# Patient Record
Sex: Female | Born: 1975 | Race: White | Hispanic: No | Marital: Single | Smoking: Current every day smoker
Health system: Southern US, Community
[De-identification: ages and names within clinical notes are randomized; demographics above are authoritative.]

## PROBLEM LIST (undated history)

## (undated) DIAGNOSIS — F329 Major depressive disorder, single episode, unspecified: Secondary | ICD-10-CM

## (undated) DIAGNOSIS — Z972 Presence of dental prosthetic device (complete) (partial): Secondary | ICD-10-CM

## (undated) DIAGNOSIS — F419 Anxiety disorder, unspecified: Secondary | ICD-10-CM

## (undated) DIAGNOSIS — E039 Hypothyroidism, unspecified: Secondary | ICD-10-CM

## (undated) DIAGNOSIS — Z973 Presence of spectacles and contact lenses: Secondary | ICD-10-CM

## (undated) DIAGNOSIS — R519 Headache, unspecified: Secondary | ICD-10-CM

## (undated) DIAGNOSIS — M8700 Idiopathic aseptic necrosis of unspecified bone: Secondary | ICD-10-CM

## (undated) DIAGNOSIS — R51 Headache: Secondary | ICD-10-CM

## (undated) DIAGNOSIS — J45909 Unspecified asthma, uncomplicated: Secondary | ICD-10-CM

## (undated) DIAGNOSIS — G2581 Restless legs syndrome: Secondary | ICD-10-CM

## (undated) DIAGNOSIS — M419 Scoliosis, unspecified: Secondary | ICD-10-CM

## (undated) DIAGNOSIS — Z8489 Family history of other specified conditions: Secondary | ICD-10-CM

## (undated) DIAGNOSIS — K759 Inflammatory liver disease, unspecified: Secondary | ICD-10-CM

## (undated) DIAGNOSIS — M539 Dorsopathy, unspecified: Secondary | ICD-10-CM

## (undated) DIAGNOSIS — F32A Depression, unspecified: Secondary | ICD-10-CM

## (undated) HISTORY — PX: JOINT REPLACEMENT: SHX530

## (undated) HISTORY — PX: ABDOMINAL HYSTERECTOMY: SHX81

## (undated) HISTORY — PX: DILATION AND CURETTAGE OF UTERUS: SHX78

---

## 2004-05-28 ENCOUNTER — Other Ambulatory Visit: Payer: Self-pay

## 2004-07-19 ENCOUNTER — Emergency Department: Payer: Self-pay | Admitting: Emergency Medicine

## 2005-02-24 ENCOUNTER — Emergency Department: Payer: Self-pay | Admitting: Emergency Medicine

## 2005-02-24 ENCOUNTER — Other Ambulatory Visit: Payer: Self-pay

## 2005-04-23 ENCOUNTER — Emergency Department: Payer: Self-pay | Admitting: Emergency Medicine

## 2005-07-24 ENCOUNTER — Ambulatory Visit: Payer: Self-pay | Admitting: Unknown Physician Specialty

## 2005-08-07 ENCOUNTER — Ambulatory Visit: Payer: Self-pay | Admitting: Unknown Physician Specialty

## 2005-12-17 ENCOUNTER — Emergency Department: Payer: Self-pay | Admitting: Unknown Physician Specialty

## 2006-05-04 ENCOUNTER — Emergency Department: Payer: Self-pay

## 2006-05-25 ENCOUNTER — Inpatient Hospital Stay: Payer: Self-pay | Admitting: Internal Medicine

## 2006-08-07 ENCOUNTER — Emergency Department: Payer: Self-pay | Admitting: Internal Medicine

## 2006-09-08 ENCOUNTER — Ambulatory Visit: Payer: Self-pay | Admitting: Unknown Physician Specialty

## 2006-10-20 ENCOUNTER — Inpatient Hospital Stay: Payer: Self-pay | Admitting: Unknown Physician Specialty

## 2006-11-05 ENCOUNTER — Observation Stay: Payer: Self-pay | Admitting: Unknown Physician Specialty

## 2006-12-02 ENCOUNTER — Ambulatory Visit: Payer: Self-pay | Admitting: Anesthesiology

## 2006-12-08 ENCOUNTER — Ambulatory Visit: Payer: Self-pay | Admitting: Anesthesiology

## 2006-12-13 ENCOUNTER — Other Ambulatory Visit: Payer: Self-pay

## 2006-12-13 ENCOUNTER — Emergency Department: Payer: Self-pay | Admitting: Emergency Medicine

## 2006-12-15 ENCOUNTER — Ambulatory Visit: Payer: Self-pay | Admitting: Anesthesiology

## 2007-04-17 ENCOUNTER — Emergency Department: Payer: Self-pay

## 2007-05-05 ENCOUNTER — Emergency Department: Payer: Self-pay | Admitting: Emergency Medicine

## 2007-05-05 ENCOUNTER — Other Ambulatory Visit: Payer: Self-pay

## 2007-06-02 ENCOUNTER — Ambulatory Visit: Payer: Self-pay | Admitting: Unknown Physician Specialty

## 2007-06-16 ENCOUNTER — Ambulatory Visit: Payer: Self-pay | Admitting: Unknown Physician Specialty

## 2007-06-17 ENCOUNTER — Encounter: Admission: RE | Admit: 2007-06-17 | Discharge: 2007-06-17 | Payer: Self-pay | Admitting: Orthopedic Surgery

## 2007-09-01 ENCOUNTER — Encounter: Payer: Self-pay | Admitting: Neurosurgery

## 2007-11-16 ENCOUNTER — Emergency Department: Payer: Self-pay | Admitting: Emergency Medicine

## 2008-07-20 ENCOUNTER — Other Ambulatory Visit: Payer: Self-pay

## 2008-07-21 ENCOUNTER — Inpatient Hospital Stay: Payer: Self-pay | Admitting: Internal Medicine

## 2008-08-05 ENCOUNTER — Emergency Department (HOSPITAL_COMMUNITY): Admission: EM | Admit: 2008-08-05 | Discharge: 2008-08-05 | Payer: Self-pay | Admitting: Emergency Medicine

## 2008-11-04 ENCOUNTER — Emergency Department: Payer: Self-pay | Admitting: Emergency Medicine

## 2009-02-03 ENCOUNTER — Emergency Department: Payer: Self-pay | Admitting: Emergency Medicine

## 2009-05-26 ENCOUNTER — Emergency Department: Payer: Self-pay | Admitting: Emergency Medicine

## 2009-06-21 ENCOUNTER — Emergency Department (HOSPITAL_COMMUNITY): Admission: EM | Admit: 2009-06-21 | Discharge: 2009-06-21 | Payer: Self-pay | Admitting: Emergency Medicine

## 2009-06-29 ENCOUNTER — Ambulatory Visit: Payer: Self-pay | Admitting: General Practice

## 2009-10-13 ENCOUNTER — Ambulatory Visit: Payer: Self-pay | Admitting: Internal Medicine

## 2009-10-30 ENCOUNTER — Ambulatory Visit: Payer: Self-pay | Admitting: Internal Medicine

## 2009-11-13 ENCOUNTER — Ambulatory Visit: Payer: Self-pay | Admitting: Internal Medicine

## 2009-11-19 ENCOUNTER — Emergency Department: Payer: Self-pay | Admitting: Emergency Medicine

## 2009-12-11 ENCOUNTER — Ambulatory Visit: Payer: Self-pay | Admitting: Internal Medicine

## 2009-12-18 ENCOUNTER — Emergency Department: Payer: Self-pay | Admitting: Emergency Medicine

## 2010-02-04 ENCOUNTER — Emergency Department: Payer: Self-pay | Admitting: Emergency Medicine

## 2010-06-11 ENCOUNTER — Inpatient Hospital Stay: Payer: Self-pay | Admitting: Internal Medicine

## 2010-06-14 ENCOUNTER — Inpatient Hospital Stay: Payer: Self-pay | Admitting: Psychiatry

## 2010-06-27 ENCOUNTER — Emergency Department: Payer: Self-pay | Admitting: Emergency Medicine

## 2010-09-17 ENCOUNTER — Emergency Department: Payer: Self-pay | Admitting: Emergency Medicine

## 2011-02-13 ENCOUNTER — Ambulatory Visit: Payer: Self-pay

## 2011-05-08 ENCOUNTER — Emergency Department: Payer: Self-pay | Admitting: Emergency Medicine

## 2011-05-22 ENCOUNTER — Emergency Department: Payer: Self-pay | Admitting: Emergency Medicine

## 2011-07-30 ENCOUNTER — Emergency Department: Payer: Self-pay | Admitting: Internal Medicine

## 2011-10-08 ENCOUNTER — Emergency Department: Payer: Self-pay | Admitting: Emergency Medicine

## 2011-10-09 ENCOUNTER — Emergency Department: Payer: Self-pay | Admitting: Emergency Medicine

## 2011-11-09 ENCOUNTER — Emergency Department: Payer: Self-pay | Admitting: Internal Medicine

## 2012-02-05 ENCOUNTER — Emergency Department: Payer: Self-pay | Admitting: Emergency Medicine

## 2012-03-31 ENCOUNTER — Emergency Department: Payer: Self-pay

## 2012-05-07 ENCOUNTER — Emergency Department: Payer: Self-pay | Admitting: Emergency Medicine

## 2012-05-07 LAB — CBC
MCH: 33 pg (ref 26.0–34.0)
MCHC: 34.6 g/dL (ref 32.0–36.0)
MCV: 95 fL (ref 80–100)

## 2012-05-07 LAB — COMPREHENSIVE METABOLIC PANEL
Alkaline Phosphatase: 81 U/L (ref 50–136)
BUN: 9 mg/dL (ref 7–18)
Bilirubin,Total: 0.2 mg/dL (ref 0.2–1.0)
Chloride: 105 mmol/L (ref 98–107)
Creatinine: 0.83 mg/dL (ref 0.60–1.30)
EGFR (African American): >60
SGPT (ALT): 13 U/L
Total Protein: 7.2 g/dL (ref 6.4–8.2)

## 2012-05-07 LAB — URINALYSIS, COMPLETE
Ph: 5 (ref 4.5–8.0)
Protein: NEGATIVE
Squamous Epithelial: 5

## 2012-07-01 ENCOUNTER — Emergency Department: Payer: Self-pay | Admitting: Emergency Medicine

## 2012-08-11 ENCOUNTER — Emergency Department: Payer: Self-pay | Admitting: Unknown Physician Specialty

## 2012-08-11 LAB — COMPREHENSIVE METABOLIC PANEL
Anion Gap: 10 (ref 7–16)
BUN: 8 mg/dL (ref 7–18)
Calcium, Total: 8.6 mg/dL (ref 8.5–10.1)
Chloride: 110 mmol/L — ABNORMAL HIGH (ref 98–107)
Co2: 24 mmol/L (ref 21–32)
Creatinine: 0.84 mg/dL (ref 0.60–1.30)
EGFR (Non-African Amer.): >60
Glucose: 105 mg/dL — ABNORMAL HIGH (ref 65–99)
Osmolality: 286 (ref 275–301)
Potassium: 3.6 mmol/L (ref 3.5–5.1)

## 2012-08-11 LAB — CBC
HCT: 41.2 % (ref 35.0–47.0)
HGB: 14.4 g/dL (ref 12.0–16.0)
MCHC: 34.8 g/dL (ref 32.0–36.0)
Platelet: 311 10*3/uL (ref 150–440)
WBC: 7.1 10*3/uL (ref 3.6–11.0)

## 2012-08-11 LAB — SALICYLATE LEVEL: Salicylates, Serum: 4.8 mg/dL — ABNORMAL HIGH

## 2012-08-11 LAB — ETHANOL: Ethanol %: <0.003 % (ref 0.000–0.080)

## 2012-12-01 ENCOUNTER — Ambulatory Visit: Payer: Self-pay | Admitting: Orthopedic Surgery

## 2012-12-27 ENCOUNTER — Ambulatory Visit: Payer: Self-pay | Admitting: Orthopedic Surgery

## 2012-12-27 LAB — CBC
HCT: 39.1 % (ref 35.0–47.0)
HGB: 13 g/dL (ref 12.0–16.0)
MCH: 30.9 pg (ref 26.0–34.0)
MCHC: 33.2 g/dL (ref 32.0–36.0)
MCV: 93 fL (ref 80–100)
RBC: 4.2 10*6/uL (ref 3.80–5.20)
RDW: 14.5 % (ref 11.5–14.5)
WBC: 10.5 10*3/uL (ref 3.6–11.0)

## 2012-12-27 LAB — BASIC METABOLIC PANEL
Calcium, Total: 8.6 mg/dL (ref 8.5–10.1)
Chloride: 106 mmol/L (ref 98–107)
EGFR (Non-African Amer.): >60
Osmolality: 275 (ref 275–301)
Potassium: 4.1 mmol/L (ref 3.5–5.1)
Sodium: 139 mmol/L (ref 136–145)

## 2013-01-06 ENCOUNTER — Ambulatory Visit: Payer: Self-pay | Admitting: Orthopedic Surgery

## 2013-01-06 LAB — DRUG SCREEN, URINE
Amphetamines, Ur Screen: NEGATIVE (ref ?–1000)
Barbiturates, Ur Screen: NEGATIVE (ref ?–200)
Benzodiazepine, Ur Scrn: NEGATIVE (ref ?–200)
Cannabinoid 50 Ng, Ur ~~LOC~~: NEGATIVE (ref ?–50)
Cocaine Metabolite,Ur ~~LOC~~: POSITIVE (ref ?–300)
MDMA (Ecstasy)Ur Screen: NEGATIVE (ref ?–500)
Methadone, Ur Screen: POSITIVE (ref ?–300)
Opiate, Ur Screen: POSITIVE (ref ?–300)
Phencyclidine (PCP) Ur S: NEGATIVE (ref ?–25)
Tricyclic, Ur Screen: NEGATIVE (ref ?–1000)

## 2013-02-08 ENCOUNTER — Inpatient Hospital Stay: Payer: Self-pay | Admitting: Orthopedic Surgery

## 2013-02-08 LAB — DRUG SCREEN, URINE
Benzodiazepine, Ur Scrn: POSITIVE (ref ?–200)
MDMA (Ecstasy)Ur Screen: NEGATIVE (ref ?–500)
Tricyclic, Ur Screen: NEGATIVE (ref ?–1000)

## 2013-02-09 LAB — HEMOGLOBIN: HGB: 12 g/dL (ref 12.0–16.0)

## 2013-02-09 LAB — BASIC METABOLIC PANEL
Calcium, Total: 8.1 mg/dL — ABNORMAL LOW (ref 8.5–10.1)
Chloride: 108 mmol/L — ABNORMAL HIGH (ref 98–107)
Co2: 27 mmol/L (ref 21–32)
Potassium: 3.6 mmol/L (ref 3.5–5.1)
Sodium: 139 mmol/L (ref 136–145)

## 2013-02-11 LAB — PATHOLOGY REPORT

## 2013-03-29 ENCOUNTER — Ambulatory Visit: Payer: Self-pay | Admitting: Orthopedic Surgery

## 2013-03-29 LAB — DRUG SCREEN, URINE
Amphetamines, Ur Screen: NEGATIVE (ref ?–1000)
Benzodiazepine, Ur Scrn: NEGATIVE (ref ?–200)
Cannabinoid 50 Ng, Ur ~~LOC~~: NEGATIVE (ref ?–50)

## 2013-07-03 ENCOUNTER — Emergency Department: Payer: Self-pay | Admitting: Emergency Medicine

## 2013-09-14 IMAGING — CR DG CHEST 2V
1 series · 2 of 2 positions shown · non-contrast
Comparison: none

REASON FOR EXAM: cough, sob
COMMENTS:   May transport without cardiac monitor

[Series 1: w chest pa · 0.14mm/px · 2 of 2 slices shown]
[im 1/2]
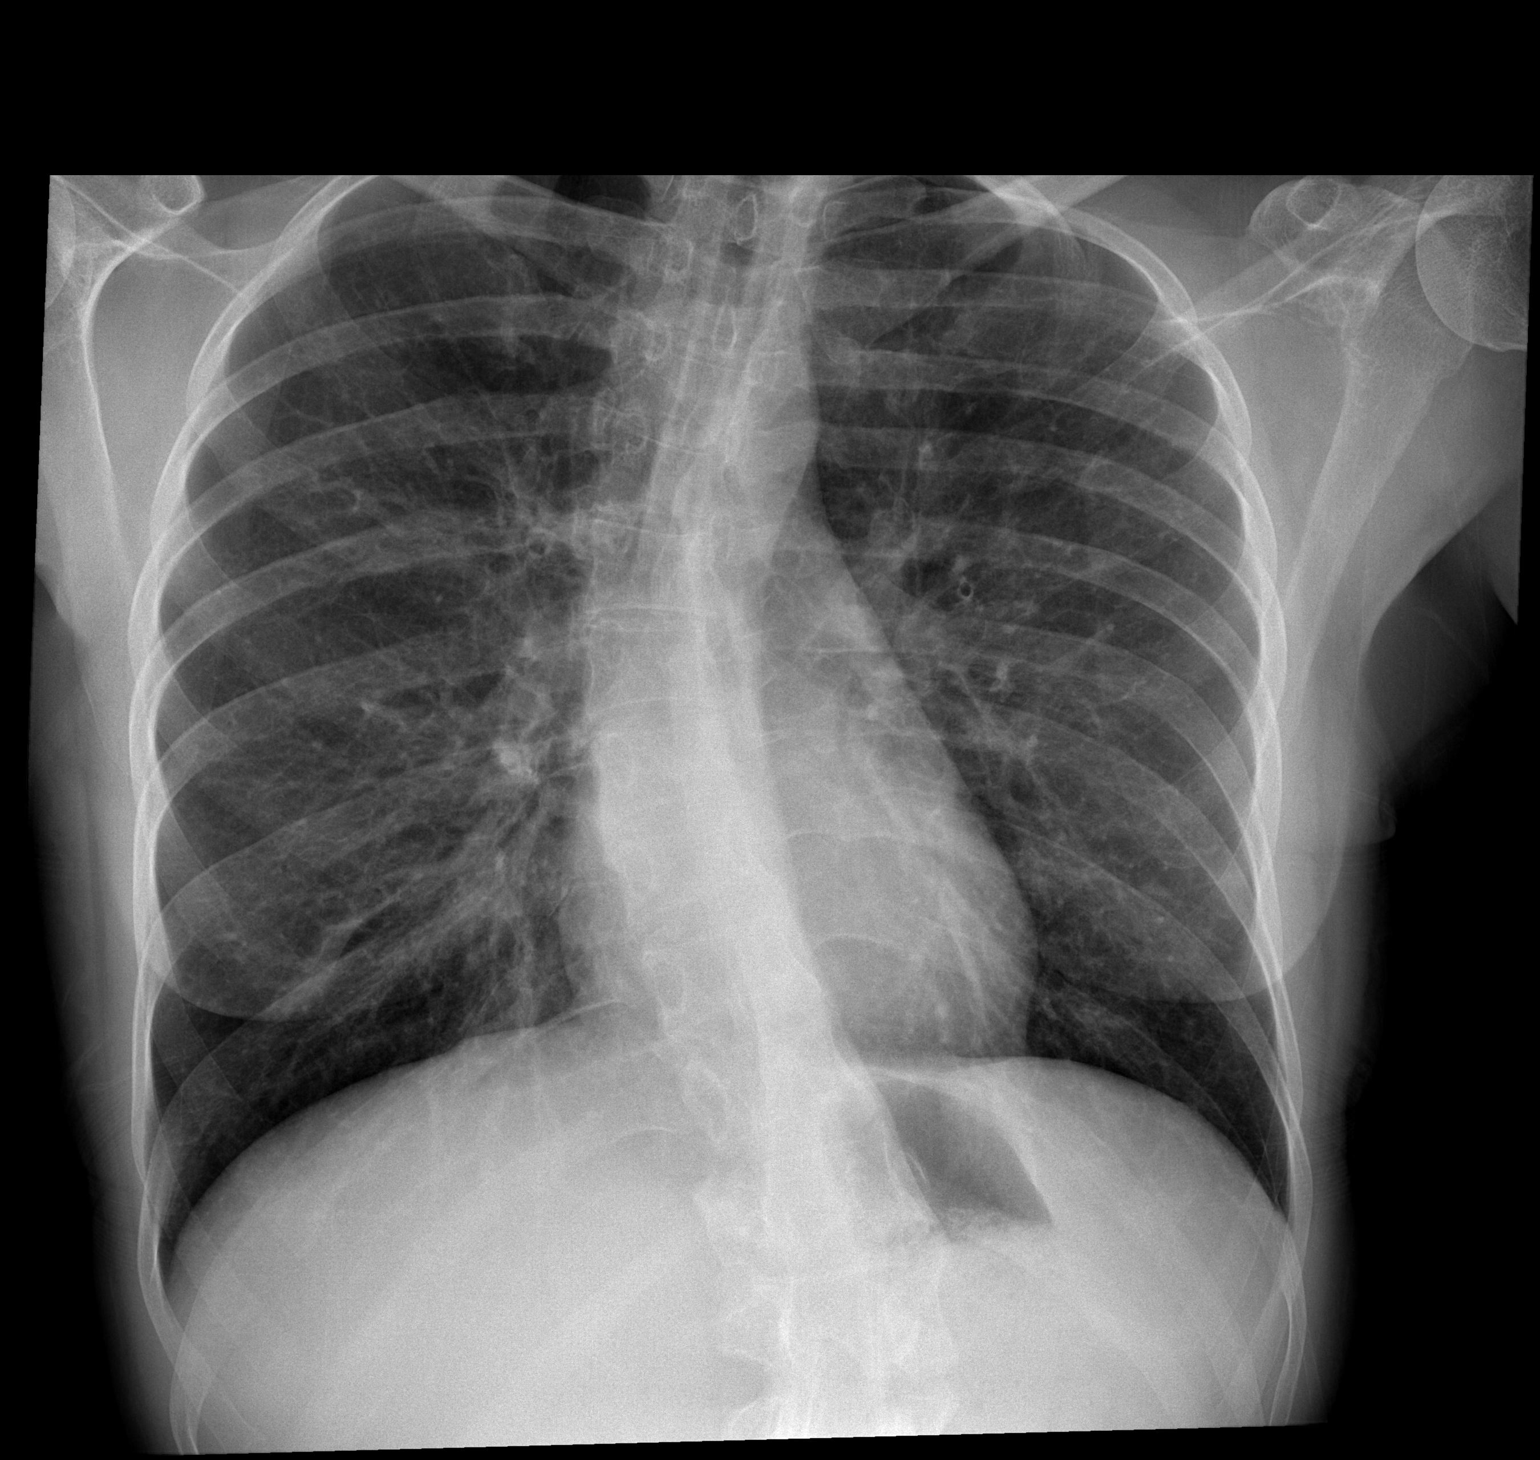
[im 2/2]
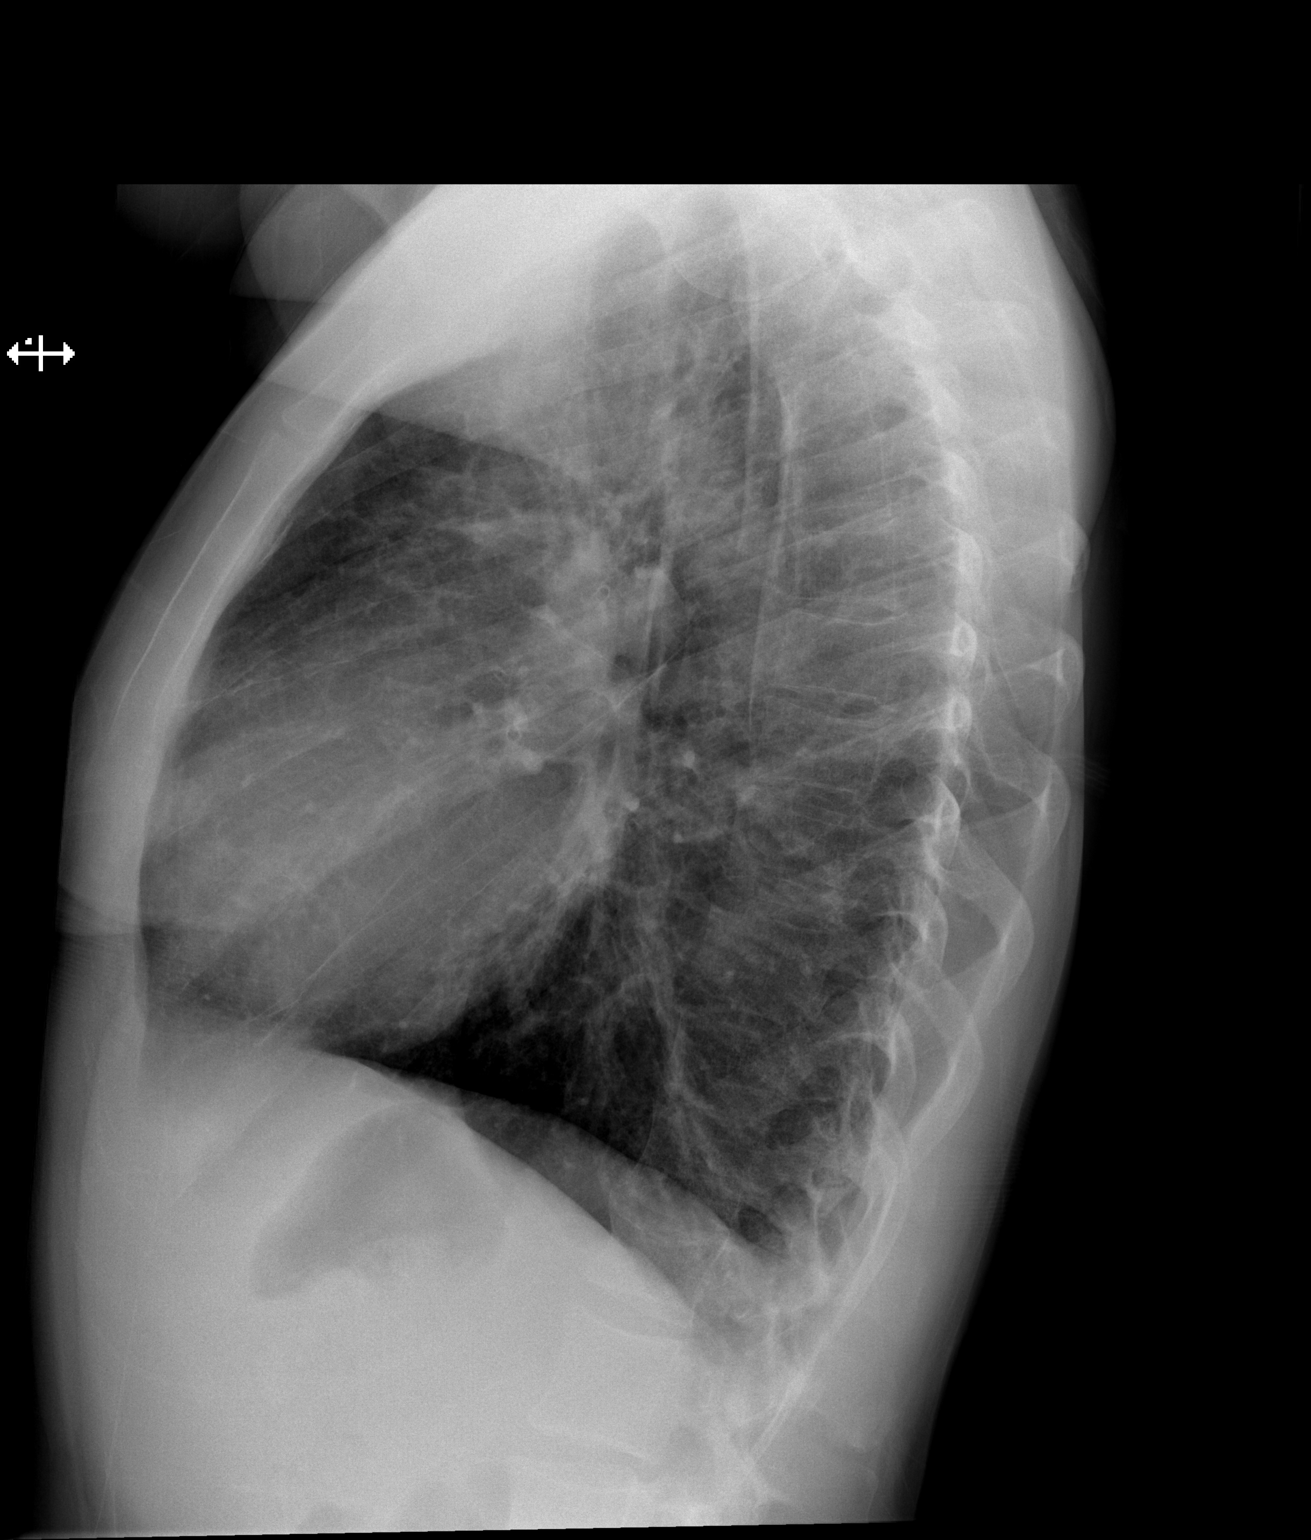

[2 of 2 positions shown; findings below may reference images not displayed]

PROCEDURE:     DXR - DXR CHEST PA (OR AP) AND LATERAL  - July 30, 2011  [DATE]

RESULT:     Comparison is made to a prior exam of 06/11/2010. The lung fields
are clear. Heart and pulmonary vascularity show no significant
abnormalities. The mediastinal structures are normal in appearance. Again
observed is a moderate thoracolumbar scoliosis.
IMPRESSION: No acute changes are identified.

## 2014-04-30 ENCOUNTER — Emergency Department: Payer: Self-pay | Admitting: Emergency Medicine

## 2014-04-30 LAB — URINALYSIS, COMPLETE
Bilirubin,UR: NEGATIVE
GLUCOSE, UR: NEGATIVE mg/dL (ref 0–75)
KETONE: NEGATIVE
NITRITE: NEGATIVE
PH: 5 (ref 4.5–8.0)
PROTEIN: NEGATIVE
RBC,UR: 4 /HPF (ref 0–5)
Specific Gravity: 1.019 (ref 1.003–1.030)

## 2014-05-03 ENCOUNTER — Emergency Department: Payer: Self-pay | Admitting: Emergency Medicine

## 2014-05-03 LAB — URINALYSIS, COMPLETE
BILIRUBIN, UR: NEGATIVE
Blood: NEGATIVE
GLUCOSE, UR: NEGATIVE mg/dL (ref 0–75)
KETONE: NEGATIVE
Nitrite: NEGATIVE
PH: 6 (ref 4.5–8.0)
PROTEIN: NEGATIVE
RBC, UR: NONE SEEN /HPF (ref 0–5)
Specific Gravity: 1.005 (ref 1.003–1.030)
Squamous Epithelial: 2

## 2014-07-25 ENCOUNTER — Emergency Department: Payer: Self-pay | Admitting: Emergency Medicine

## 2014-07-27 ENCOUNTER — Emergency Department: Payer: Self-pay | Admitting: Emergency Medicine

## 2014-07-27 LAB — CBC WITH DIFFERENTIAL/PLATELET
Basophil #: 0.1 10*3/uL (ref 0.0–0.1)
Basophil %: 0.4 %
EOS ABS: 0 10*3/uL (ref 0.0–0.7)
EOS PCT: 0.2 %
HCT: 40.6 % (ref 35.0–47.0)
HGB: 13.1 g/dL (ref 12.0–16.0)
LYMPHS PCT: 7.8 %
Lymphocyte #: 1.2 10*3/uL (ref 1.0–3.6)
MCH: 32.1 pg (ref 26.0–34.0)
MCHC: 32.4 g/dL (ref 32.0–36.0)
MCV: 99 fL (ref 80–100)
MONO ABS: 1.1 x10 3/mm — AB (ref 0.2–0.9)
MONOS PCT: 7.2 %
NEUTROS ABS: 13.1 10*3/uL — AB (ref 1.4–6.5)
Neutrophil %: 84.4 %
PLATELETS: 262 10*3/uL (ref 150–440)
RBC: 4.09 10*6/uL (ref 3.80–5.20)
RDW: 13 % (ref 11.5–14.5)
WBC: 15.6 10*3/uL — ABNORMAL HIGH (ref 3.6–11.0)

## 2014-07-27 LAB — COMPREHENSIVE METABOLIC PANEL
ALBUMIN: 2.7 g/dL — AB (ref 3.4–5.0)
AST: 29 U/L (ref 15–37)
Alkaline Phosphatase: 129 U/L — ABNORMAL HIGH
Anion Gap: 5 — ABNORMAL LOW (ref 7–16)
BUN: 7 mg/dL (ref 7–18)
Bilirubin,Total: 0.4 mg/dL (ref 0.2–1.0)
CALCIUM: 8.6 mg/dL (ref 8.5–10.1)
CO2: 27 mmol/L (ref 21–32)
Chloride: 105 mmol/L (ref 98–107)
Creatinine: 0.95 mg/dL (ref 0.60–1.30)
EGFR (African American): >60
EGFR (Non-African Amer.): >60
Glucose: 155 mg/dL — ABNORMAL HIGH (ref 65–99)
OSMOLALITY: 275 (ref 275–301)
Potassium: 3.9 mmol/L (ref 3.5–5.1)
SGPT (ALT): 97 U/L — ABNORMAL HIGH
Sodium: 137 mmol/L (ref 136–145)
Total Protein: 7.1 g/dL (ref 6.4–8.2)

## 2014-07-27 LAB — URINALYSIS, COMPLETE
BILIRUBIN, UR: NEGATIVE
GLUCOSE, UR: NEGATIVE mg/dL (ref 0–75)
KETONE: NEGATIVE
NITRITE: NEGATIVE
Ph: 6 (ref 4.5–8.0)
Protein: NEGATIVE
RBC,UR: 2 /HPF (ref 0–5)
SPECIFIC GRAVITY: 1.002 (ref 1.003–1.030)
WBC UR: 52 /HPF (ref 0–5)

## 2014-07-27 LAB — WET PREP, GENITAL

## 2014-07-28 LAB — GC/CHLAMYDIA PROBE AMP

## 2014-07-31 LAB — INFLUENZA A,B,H1N1 - PCR (ARMC)
H1N1FLUPCR: NOT DETECTED
INFLAPCR: NEGATIVE
Influenza B By PCR: NEGATIVE
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
## 2014-12-06 ENCOUNTER — Ambulatory Visit: Payer: Self-pay | Admitting: Gastroenterology

## 2014-12-27 ENCOUNTER — Ambulatory Visit: Payer: Self-pay | Admitting: Gastroenterology

## 2015-01-19 ENCOUNTER — Emergency Department: Admit: 2015-01-19 | Discharge status: Against Medical Advice | Payer: Self-pay | Admitting: Emergency Medicine

## 2015-01-21 ENCOUNTER — Emergency Department
Admit: 2015-01-21 | Disposition: A | Discharge status: Home / Self Care | Payer: Self-pay | Admitting: Physician Assistant

## 2015-02-02 NOTE — Op Note (Signed)
PATIENT NAME:  Kristin Orr, Kristin Orr MR#:  [id number] DATE OF BIRTH:  August 09, 1976  DATE OF PROCEDURE:  03/29/2013  PREOPERATIVE DIAGNOSIS:  Arthrofibrosis, right knee.   POSTOPERATIVE DIAGNOSIS:  Arthrofibrosis, right knee.   PROCEDURE:  Right knee manipulation.   ANESTHESIA:  General.   SURGEON:  Leitha SchullerMichael J. Ina Scrivens, MD  DESCRIPTION OF PROCEDURE:  The patient was brought to the operating room and after adequate anesthesia was obtained, appropriate patient identification and timeout procedures were carried out. Examination initially revealed range of motion of 5 to 85 degrees. The knee was gently and slowly brought up into flexion with pressure applied to the proximal tibia with the hip in flexion. There was palpable and audible popping of adhesions and the knee could be brought back to 120 degrees of flexion. Bringing the leg out in extension, pressure was applied anteriorly and then there was some stretching, but the knee resumed a passively 5 degree flexion contracture. Patella was mobile. The patient was then sent to the recovery room in stable condition.   ESTIMATED BLOOD LOSS:  None.   COMPLICATIONS:  None.   SPECIMEN:  None.    ____________________________ Leitha SchullerMichael J. Shirrell Solinger, MD mjm:si Orr: 03/29/2013 22:35:30 ET T: 03/30/2013 00:10:00 ET JOB#: [id number]  cc: Leitha SchullerMichael J. Emma Schupp, MD, <Dictator> Leitha SchullerMICHAEL J Deicy Rusk MD ELECTRONICALLY SIGNED 03/30/2013 8:25

## 2015-02-02 NOTE — Op Note (Signed)
PATIENT NAME:  Kristin Orr, Kristin Orr MR#:  [id number] DATE OF BIRTH:  17-Apr-1976  DATE OF PROCEDURE:  02/08/2013  PREOPERATIVE DIAGNOSIS: Avascular necrosis, right knee.   POSTOPERATIVE DIAGNOSIS: Avascular necrosis right knee.   PROCEDURE: Right total knee replacement.   ANESTHESIA: Spinal.   SURGEON: Leitha SchullerMichael J. Peace Noyes, MD   ASSISTANT:  Cranston Neighborhris Gaines, PA-C.   DESCRIPTION OF PROCEDURE: The patient was brought to the operating room and after adequate spinal anesthesia was obtained, the right leg was prepped and draped in the usual sterile fashion with a tourniquet applied to the upper thigh. After patient identification and timeout procedures were completed, the leg was exsanguinated with use of an Esmarch and the tourniquet raised to 300 mmHg. A midline skin incision was made followed by a medial parapatellar arthrotomy. Inspection revealed some degenerative change to the knee with mild synovitis. The anterior cruciate ligament was excised along with the infrapatellar fat pad. The proximal tibia was exposed for application of the proximal tibia cutting guide, which was applied and proximal tibia cut carried out. The Mitek femoral cutting guide was then applied after removal of cartilage and distal femur cut made. The distal femur cut was finished with the five and one block using the size 3 cutting block. Trials were placed and the tibia cut appeared to be in slight valgus, so it was recut using an extramedullary guide and this gave excellent alignment of the tibia. The tibia was sized to a size 2 and with appropriate rotation, was pinned in place. Proximal drill hole made followed by the notch cut. The trials were placed and with a 12 mm implant insert, there was excellent stability to the knee in mid flexion and extension. The patella was then cut using the guide and measured a size 2 after drilling. At this point, the bony surfaces were thoroughly irrigated and dried. Exparel was injected in the periarticular  tissue to minimize postoperative pain. The wound was again irrigated and dried and the component cemented into place. After the cement had set, the tourniquet was let down and hemostasis checked with electrocautery. The arthrotomy was repaired using a heavy quill suture followed by 2-0 quill subcutaneously, staples, Xeroform, 4 x 4's, Webril, Polar Care, and Ace wrap. The patient was then sent to the recovery room in stable condition.   ESTIMATED BLOOD LOSS: 150 mL  COMPLICATIONS: None.   SPECIMEN: Cut ends of bone.   TOURNIQUET TIME: 79 minutes at 300 mmHg   IMPLANTS: Medacta GMK primary total knee system, size 2, right fixed tibial  tray with a size 2, 12 mm standard insert, a size 2 patella and a size 3 narrow right standard femoral component.    ____________________________ Leitha SchullerMichael J. Chauncey Sciulli, MD mjm:cc Orr: 02/08/2013 20:53:11 ET T: 02/08/2013 21:11:05 ET JOB#: [id number]  cc: Leitha SchullerMichael J. Tradarius Reinwald, MD, <Dictator> Leitha SchullerMICHAEL J Mahrosh Donnell MD ELECTRONICALLY SIGNED 02/09/2013 8:08

## 2015-02-02 NOTE — Discharge Summary (Signed)
PATIENT NAME:  Kristin Orr, Kristin Orr MR#:  [id number] DATE OF BIRTH:  18-May-1976  DATE OF ADMISSION:  02/08/2013 DATE OF DISCHARGE:  02/11/2013  PREOPERATIVE DIAGNOSIS: Avascular necrosis of right knee.  DISCHARGE DIAGNOSES:   Avascular necrosis of right knee.   PROCEDURE: Right total knee replacement.   ANESTHESIA: Spinal.   SURGEON: Leitha SchullerMichael J. Menz, M.Orr.   ASSISTANT: Cranston Neighborhris Peggie Hornak, PA-C  ESTIMATED BLOOD LOSS: 150 mL.   COMPLICATIONS: None.   SPECIMEN: Cut ends of bone.   TOURNIQUET TIME: 59 minutes at 300 mmHg.  IMPLANTS: Medacta GMK primary total knee system size 2, right fixed tibial tray with a size 2 12-mm standard insert, a size 2 patella and a size 3 narrow right standard femoral component.   HISTORY: The patient is a 39 year old female who has had significant history of right knee pain. She has been diagnosed with avascular necrosis and has periods where she is nonambulatory due to severe pain in the right knee. Despite conservative treatment the patient did not have any relief with her pain. The patient agreed and consented to right total knee replacement.   PHYSICAL EXAMINATION: GENERAL: Well developed, well-nourished female in no apparent distress. Normal affect. Antalgic component on right lower extremity.  HEENT: Head normocephalic, atraumatic.  Pupils equal, round and reactive to light.  HEART: Regular rate and rhythm.  LUNGS: Clear to auscultation bilaterally. No wheezing, rales or rhonchi.  RIGHT KNEE:  Shows the patient has no swelling, warmth, erythema or effusion. She has good range of motion with flexion and extension. There is no laxity with valgus and varus stress testing. She has a negative anterior/posterior drawer test as well as a negative Lachman test. The patient has tenderness of the medial and lateral joint line. She has severe pain with ambulation. She is neurovascularly intact in the right lower extremity.   HOSPITAL COURSE: The patient was admitted to the  hospital on 02/08/2013. She had surgery that same day and was brought to the orthopedic floor from the PAC-U in stable condition. The patient on postop day 1 had good vital signs and lab work was within normal limits. She had good progress with physical therapy. On postop day 2, the patient continued to progress with physical therapy. Pain continued to improve. On postop day 3, the patient continued to have mild to moderate pain, but overall she was doing well. She progressed well with physical therapy, vital signs were stable, and she was ready for discharge home with home health physical therapy.   DISCHARGE INSTRUCTIONS: The patient may gradually increase weight-bearing on the affected extremity and needs to elevate the affected foot or leg on 1 or 2 pillows with the foot higher than the knee. Thigh-high TED hose on both legs and remove at bedtime and replace when arising the next morning. Elevate heels off the bed and use incentive spirometry every 1 hour while awake and encourage cough and deep breathing. She may resume a regular diet as tolerated. Continue using Polar Care unit. Maintain temp between 40 and 50 degrees. She is not to get the dressing bandage wet or dirty. [facility] if the dressing gets water under it. Leave the dressing on. [facility] if any of the following occur: Bright red bleeding from the incision or wound, fever above 101.5 degrees, redness, swelling or drainage at the incision. Call [facility]oped[facility] if you experience any increased leg pain, numbness or weakness in your legs or bowel or bladder symptoms. She is  referred to home physical therapy. She is to call [facility] orthopedics if a therapist has not contacted her within 48 hours of her return home. She has a follow-up appointment in 2 weeks with [facility] ortho.  DISCHARGE MEDICATIONS: 1.  Methadone 10 mg oral tablet 1 tablet orally 3 times a day. 2.   Clonazepam 0.5 mg oral tablet 1 tablet orally 3 times a day. 3.  Lyrica 300 mg oral capsule 1 dose orally once a day at bedtime.  4.  Oxycodone 10 mg oral tablet 1 tablet orally every 3 hours as needed for pain.  5.  Magnesium hydroxide 8% oral suspension 30 mL orally 2 times a day as needed for constipation. 6.  Xarelto 10 mg oral tablet 1 tablet orally once a day in the morning.  7.  Quetiapine 50 mg oral tablet extended release 1 tablet orally 2 times a day. 8.  Bisacodyl 10 mg rectal suppository 1 suppository rectally once a day as needed for constipation.  ____________________________ Evon Slack, PA-C tcg:sb Orr: 02/11/2013 08:13:04 ET T: 02/11/2013 08:56:29 ET JOB#: [id number]  cc: Evon Slack, PA-C, <Dictator> Evon Slack PA ELECTRONICALLY SIGNED 03/01/2013 10:20

## 2015-02-03 ENCOUNTER — Ambulatory Visit
Admit: 2015-02-03 | Disposition: A | Discharge status: Home / Self Care | Payer: Self-pay | Attending: Orthopedic Surgery | Admitting: Orthopedic Surgery

## 2015-02-08 ENCOUNTER — Other Ambulatory Visit: Admit: 2015-02-08 | Disposition: A | Discharge status: Home / Self Care | Payer: Self-pay | Admitting: Urgent Care

## 2015-02-08 LAB — BASIC METABOLIC PANEL
Anion Gap: 4 — ABNORMAL LOW (ref 7–16)
BUN: 10 mg/dL
CHLORIDE: 106 mmol/L
CREATININE: 0.72 mg/dL
Calcium, Total: 9 mg/dL
Co2: 28 mmol/L
EGFR (African American): >60
Glucose: 103 mg/dL — ABNORMAL HIGH
POTASSIUM: 4.4 mmol/L
SODIUM: 138 mmol/L

## 2015-02-08 LAB — CBC WITH DIFFERENTIAL/PLATELET
BASOS ABS: 0 10*3/uL (ref 0.0–0.1)
BASOS PCT: 0.3 %
EOS ABS: 0.1 10*3/uL (ref 0.0–0.7)
Eosinophil %: 0.7 %
HCT: 40.5 % (ref 35.0–47.0)
HGB: 13.2 g/dL (ref 12.0–16.0)
Lymphocyte #: 2.4 10*3/uL (ref 1.0–3.6)
Lymphocyte %: 21.1 %
MCH: 30.8 pg (ref 26.0–34.0)
MCHC: 32.7 g/dL (ref 32.0–36.0)
MCV: 94 fL (ref 80–100)
MONO ABS: 0.5 x10 3/mm (ref 0.2–0.9)
Monocyte %: 4.8 %
NEUTROS PCT: 73.1 %
Neutrophil #: 8.3 10*3/uL — ABNORMAL HIGH (ref 1.4–6.5)
Platelet: 333 10*3/uL (ref 150–440)
RBC: 4.3 10*6/uL (ref 3.80–5.20)
RDW: 12.3 % (ref 11.5–14.5)
WBC: 11.4 10*3/uL — ABNORMAL HIGH (ref 3.6–11.0)

## 2015-02-08 LAB — HEPATIC FUNCTION PANEL A (ARMC)
ALBUMIN: 4 g/dL
ALT: 9 U/L — AB
AST: 14 U/L — AB
Alkaline Phosphatase: 79 U/L
BILIRUBIN TOTAL: 0.6 mg/dL
Bilirubin, Direct: 0.1 mg/dL
Indirect Bilirubin: 0.5
TOTAL PROTEIN: 7.1 g/dL

## 2015-02-08 LAB — URINALYSIS, COMPLETE
BILIRUBIN, UR: NEGATIVE
BLOOD: NEGATIVE
Glucose,UR: NEGATIVE mg/dL (ref 0–75)
Ketone: NEGATIVE
LEUKOCYTE ESTERASE: NEGATIVE
Nitrite: NEGATIVE
PH: 5 (ref 4.5–8.0)
PROTEIN: NEGATIVE
Specific Gravity: 1.014 (ref 1.003–1.030)

## 2015-02-08 LAB — TSH: THYROID STIMULATING HORM: 0.219 u[IU]/mL — AB

## 2015-02-08 LAB — LIPASE, BLOOD: Lipase: 23 U/L

## 2015-03-01 ENCOUNTER — Other Ambulatory Visit: Payer: Self-pay | Admitting: Orthopedic Surgery

## 2015-03-01 DIAGNOSIS — M8700 Idiopathic aseptic necrosis of unspecified bone: Secondary | ICD-10-CM

## 2015-03-01 DIAGNOSIS — M87052 Idiopathic aseptic necrosis of left femur: Secondary | ICD-10-CM

## 2015-03-09 ENCOUNTER — Ambulatory Visit: Payer: Medicaid Other

## 2015-03-19 ENCOUNTER — Ambulatory Visit
Admission: RE | Admit: 2015-03-19 | Discharge: 2015-03-19 | Disposition: A | Discharge status: Home / Self Care | Payer: Medicaid Other | Source: Ambulatory Visit | Attending: Orthopedic Surgery | Admitting: Orthopedic Surgery

## 2015-03-19 ENCOUNTER — Other Ambulatory Visit: Payer: Self-pay | Admitting: Orthopedic Surgery

## 2015-03-19 DIAGNOSIS — M25462 Effusion, left knee: Secondary | ICD-10-CM | POA: Diagnosis not present

## 2015-03-19 DIAGNOSIS — M25562 Pain in left knee: Secondary | ICD-10-CM | POA: Diagnosis not present

## 2015-03-19 DIAGNOSIS — M8700 Idiopathic aseptic necrosis of unspecified bone: Secondary | ICD-10-CM

## 2015-04-18 ENCOUNTER — Encounter
Admission: RE | Admit: 2015-04-18 | Discharge: 2015-04-18 | Disposition: A | Discharge status: Home / Self Care | Payer: Medicaid Other | Source: Ambulatory Visit | Attending: Orthopedic Surgery | Admitting: Orthopedic Surgery

## 2015-04-18 DIAGNOSIS — Z01812 Encounter for preprocedural laboratory examination: Secondary | ICD-10-CM | POA: Insufficient documentation

## 2015-04-18 HISTORY — DX: Inflammatory liver disease, unspecified: K75.9

## 2015-04-18 HISTORY — DX: Anxiety disorder, unspecified: F41.9

## 2015-04-18 HISTORY — DX: Hypothyroidism, unspecified: E03.9

## 2015-04-18 HISTORY — DX: Idiopathic aseptic necrosis of unspecified bone: M87.00

## 2015-04-18 HISTORY — DX: Major depressive disorder, single episode, unspecified: F32.9

## 2015-04-18 HISTORY — DX: Scoliosis, unspecified: M41.9

## 2015-04-18 HISTORY — DX: Restless legs syndrome: G25.81

## 2015-04-18 HISTORY — DX: Depression, unspecified: F32.A

## 2015-04-18 LAB — URINALYSIS COMPLETE WITH MICROSCOPIC (ARMC ONLY)
Bilirubin Urine: NEGATIVE
GLUCOSE, UA: NEGATIVE mg/dL
Hgb urine dipstick: NEGATIVE
KETONES UR: NEGATIVE mg/dL
Nitrite: NEGATIVE
PH: 5 (ref 5.0–8.0)
Protein, ur: NEGATIVE mg/dL
Specific Gravity, Urine: 1.014 (ref 1.005–1.030)

## 2015-04-18 LAB — BASIC METABOLIC PANEL
ANION GAP: 9 (ref 5–15)
BUN: 8 mg/dL (ref 6–20)
CALCIUM: 9.3 mg/dL (ref 8.9–10.3)
CO2: 28 mmol/L (ref 22–32)
Chloride: 103 mmol/L (ref 101–111)
Creatinine, Ser: 0.69 mg/dL (ref 0.44–1.00)
GFR calc Af Amer: >60 mL/min (ref >60)
GFR calc non Af Amer: >60 mL/min (ref >60)
Glucose, Bld: 94 mg/dL (ref 65–99)
Potassium: 3.7 mmol/L (ref 3.5–5.1)
Sodium: 140 mmol/L (ref 135–145)

## 2015-04-18 LAB — CBC
HCT: 38.8 % (ref 35.0–47.0)
Hemoglobin: 12.5 g/dL (ref 12.0–16.0)
MCH: 31.1 pg (ref 26.0–34.0)
MCHC: 32.2 g/dL (ref 32.0–36.0)
MCV: 96.6 fL (ref 80.0–100.0)
Platelets: 337 10*3/uL (ref 150–440)
RBC: 4.01 MIL/uL (ref 3.80–5.20)
RDW: 14 % (ref 11.5–14.5)
WBC: 10.8 10*3/uL (ref 3.6–11.0)

## 2015-04-18 LAB — SURGICAL PCR SCREEN
MRSA, PCR: NEGATIVE
Staphylococcus aureus: NEGATIVE

## 2015-04-18 LAB — APTT: aPTT: 32 seconds (ref 24–36)

## 2015-04-18 LAB — PROTIME-INR
INR: 0.95
PROTHROMBIN TIME: 12.9 s (ref 11.4–15.0)

## 2015-04-18 LAB — TYPE AND SCREEN
ABO/RH(D): O POS
Antibody Screen: NEGATIVE

## 2015-04-18 LAB — SEDIMENTATION RATE: SED RATE: 41 mm/h — AB (ref 0–20)

## 2015-04-18 NOTE — Patient Instructions (Signed)
  Your procedure is scheduled on: July 12. 2016 (Tuesday) Report to Day Surgery. To find out your arrival time please call [phone] between 1PM - 3PM on April 23, 2015 (Monday).  Remember: Instructions that are not followed completely may result in serious medical risk, up to and including death, or upon the discretion of your surgeon and anesthesiologist your surgery may need to be rescheduled.    ___x_ 1. Do not eat food or drink liquids after midnight. No gum chewing or hard candies.     ____ 2. No Alcohol for 24 hours before or after surgery.   ____ 3. Bring all medications with you on the day of surgery if instructed.    ___x_ 4. Notify your doctor if there is any change in your medical condition     (cold, fever, infections).     Do not wear jewelry, make-up, hairpins, clips or nail polish.  Do not wear lotions, powders, or perfumes. You may wear deodorant.  Do not shave 48 hours prior to surgery. Men may shave face and neck.  Do not bring valuables to the hospital.    [facility] not responsible for any belongings or valuables.               Contacts, dentures or bridgework may not be worn into surgery.  Leave your suitcase in the car. After surgery it may be brought to your room.  For patients admitted to the hospital, discharge time is determined by your                treatment team.   Patients discharged the day of surgery will not be allowed to drive home.   Please read over the following fact sheets that you were given: MRSA BOOKLET, CHG INSTRUCTION SHEET     __x_ Take these medicines the morning of surgery with A SIP OF WATER:    1. Gabapentin     .  ____ Fleet Enema (as directed)   __x__ Use CHG Soap as directed  ____ Use inhalers on the day of surgery  ____ Stop metformin 2 days prior to surgery    ____ Take 1/2 of usual insulin dose the night before surgery and none on the morning of surgery.   ____ Stop Coumadin/Plavix/aspirin on   __x__ Stop  Anti-inflammatories on(STOP IBUPROFEN NOW)  ____ Stop supplements until after surgery.    ____ Bring C-Pap to the hospital.

## 2015-04-20 LAB — URINE CULTURE

## 2015-04-24 ENCOUNTER — Inpatient Hospital Stay
Admission: RE | Admit: 2015-04-24 | Discharge: 2015-04-27 | DRG: 470 | Disposition: A | Discharge status: Home Health | Payer: Medicaid Other | Source: Ambulatory Visit | Attending: Orthopedic Surgery | Admitting: Orthopedic Surgery

## 2015-04-24 ENCOUNTER — Encounter
Admission: RE | Disposition: A | Discharge status: Home Health | Payer: Self-pay | Source: Ambulatory Visit | Attending: Orthopedic Surgery

## 2015-04-24 ENCOUNTER — Inpatient Hospital Stay: Payer: Medicaid Other | Admitting: Anesthesiology

## 2015-04-24 ENCOUNTER — Inpatient Hospital Stay: Payer: Medicaid Other

## 2015-04-24 ENCOUNTER — Encounter: Payer: Self-pay | Admitting: *Deleted

## 2015-04-24 DIAGNOSIS — E039 Hypothyroidism, unspecified: Secondary | ICD-10-CM | POA: Diagnosis present

## 2015-04-24 DIAGNOSIS — M87052 Idiopathic aseptic necrosis of left femur: Secondary | ICD-10-CM | POA: Diagnosis present

## 2015-04-24 DIAGNOSIS — G8918 Other acute postprocedural pain: Secondary | ICD-10-CM

## 2015-04-24 DIAGNOSIS — M419 Scoliosis, unspecified: Secondary | ICD-10-CM | POA: Diagnosis present

## 2015-04-24 DIAGNOSIS — F329 Major depressive disorder, single episode, unspecified: Secondary | ICD-10-CM | POA: Diagnosis present

## 2015-04-24 DIAGNOSIS — G43909 Migraine, unspecified, not intractable, without status migrainosus: Secondary | ICD-10-CM | POA: Diagnosis present

## 2015-04-24 DIAGNOSIS — M8700 Idiopathic aseptic necrosis of unspecified bone: Secondary | ICD-10-CM | POA: Diagnosis present

## 2015-04-24 DIAGNOSIS — Z96651 Presence of right artificial knee joint: Secondary | ICD-10-CM | POA: Diagnosis present

## 2015-04-24 DIAGNOSIS — G2581 Restless legs syndrome: Secondary | ICD-10-CM | POA: Diagnosis present

## 2015-04-24 DIAGNOSIS — B1920 Unspecified viral hepatitis C without hepatic coma: Secondary | ICD-10-CM | POA: Diagnosis present

## 2015-04-24 DIAGNOSIS — M179 Osteoarthritis of knee, unspecified: Secondary | ICD-10-CM | POA: Diagnosis present

## 2015-04-24 DIAGNOSIS — Z886 Allergy status to analgesic agent status: Secondary | ICD-10-CM | POA: Diagnosis not present

## 2015-04-24 DIAGNOSIS — F419 Anxiety disorder, unspecified: Secondary | ICD-10-CM | POA: Diagnosis present

## 2015-04-24 DIAGNOSIS — Z882 Allergy status to sulfonamides status: Secondary | ICD-10-CM

## 2015-04-24 DIAGNOSIS — M879 Osteonecrosis, unspecified: Principal | ICD-10-CM | POA: Diagnosis present

## 2015-04-24 DIAGNOSIS — Z888 Allergy status to other drugs, medicaments and biological substances status: Secondary | ICD-10-CM | POA: Diagnosis not present

## 2015-04-24 DIAGNOSIS — M87059 Idiopathic aseptic necrosis of unspecified femur: Secondary | ICD-10-CM | POA: Diagnosis present

## 2015-04-24 HISTORY — PX: TOTAL KNEE ARTHROPLASTY: SHX125

## 2015-04-24 LAB — CBC
HEMATOCRIT: 38 % (ref 35.0–47.0)
HEMOGLOBIN: 12.5 g/dL (ref 12.0–16.0)
MCH: 31.6 pg (ref 26.0–34.0)
MCHC: 33 g/dL (ref 32.0–36.0)
MCV: 95.7 fL (ref 80.0–100.0)
Platelets: 326 10*3/uL (ref 150–440)
RBC: 3.97 MIL/uL (ref 3.80–5.20)
RDW: 13.5 % (ref 11.5–14.5)
WBC: 7.7 10*3/uL (ref 3.6–11.0)

## 2015-04-24 LAB — CREATININE, SERUM
Creatinine, Ser: 0.72 mg/dL (ref 0.44–1.00)
GFR calc Af Amer: >60 mL/min (ref >60)
GFR calc non Af Amer: >60 mL/min (ref >60)

## 2015-04-24 SURGERY — ARTHROPLASTY, KNEE, TOTAL
Anesthesia: Spinal | Laterality: Left | Wound class: Clean

## 2015-04-24 MED ORDER — MORPHINE SULFATE 10 MG/ML IJ SOLN
INTRAMUSCULAR | Status: AC
  Filled 2015-04-24: qty 1

## 2015-04-24 MED ORDER — ACETAMINOPHEN 325 MG PO TABS
650.0000 mg | ORAL_TABLET | Freq: Four times a day (QID) | ORAL | Status: DC | PRN
Start: 2015-04-24 — End: 2015-04-27
  Administered 2015-04-26: 650 mg via ORAL
  Filled 2015-04-24: qty 2

## 2015-04-24 MED ORDER — FENTANYL CITRATE (PF) 100 MCG/2ML IJ SOLN
INTRAMUSCULAR | Status: AC
  Administered 2015-04-24: 25 ug via INTRAVENOUS
  Filled 2015-04-24: qty 2

## 2015-04-24 MED ORDER — MIDAZOLAM HCL 2 MG/2ML IJ SOLN
INTRAMUSCULAR | Status: DC | PRN
  Administered 2015-04-24: 2 mg via INTRAVENOUS

## 2015-04-24 MED ORDER — NICOTINE 21 MG/24HR TD PT24
21.0000 mg | MEDICATED_PATCH | Freq: Every day | TRANSDERMAL | Status: DC
  Administered 2015-04-24 – 2015-04-27 (×3): 21 mg via TRANSDERMAL
  Filled 2015-04-24 (×4): qty 1

## 2015-04-24 MED ORDER — METOCLOPRAMIDE HCL 10 MG PO TABS: 5.0000 mg | ORAL_TABLET | Freq: Three times a day (TID) | ORAL | Status: DC | PRN

## 2015-04-24 MED ORDER — TRANEXAMIC ACID 1000 MG/10ML IV SOLN
1000.0000 mg | INTRAVENOUS | Status: AC
  Administered 2015-04-24: 926 mg via INTRAVENOUS
  Filled 2015-04-24: qty 10

## 2015-04-24 MED ORDER — FAMOTIDINE 20 MG PO TABS
20.0000 mg | ORAL_TABLET | Freq: Once | ORAL | Status: AC
  Administered 2015-04-24: 20 mg via ORAL

## 2015-04-24 MED ORDER — DEXAMETHASONE SODIUM PHOSPHATE 10 MG/ML IJ SOLN
INTRAMUSCULAR | Status: DC | PRN
  Administered 2015-04-24: 10 mg via INTRAVENOUS

## 2015-04-24 MED ORDER — NEOMYCIN-POLYMYXIN B GU 40-200000 IR SOLN
Status: DC | PRN
  Administered 2015-04-24: 16 mL

## 2015-04-24 MED ORDER — BUPIVACAINE-EPINEPHRINE (PF) 0.25% -1:200000 IJ SOLN
INTRAMUSCULAR | Status: AC
  Filled 2015-04-24: qty 30

## 2015-04-24 MED ORDER — FAMOTIDINE 20 MG PO TABS
ORAL_TABLET | ORAL | Status: AC
  Administered 2015-04-24: 20 mg via ORAL
  Filled 2015-04-24: qty 1

## 2015-04-24 MED ORDER — PROPOFOL INFUSION 10 MG/ML OPTIME
INTRAVENOUS | Status: DC | PRN
Start: 2015-04-24 — End: 2015-04-24
  Administered 2015-04-24: 80 ug/kg/min via INTRAVENOUS

## 2015-04-24 MED ORDER — PROPOFOL 10 MG/ML IV BOLUS
INTRAVENOUS | Status: DC | PRN
Start: 2015-04-24 — End: 2015-04-24
  Administered 2015-04-24 (×2): 20 mg via INTRAVENOUS

## 2015-04-24 MED ORDER — ONDANSETRON HCL 4 MG/2ML IJ SOLN: 4.0000 mg | Freq: Once | INTRAMUSCULAR | Status: DC | PRN

## 2015-04-24 MED ORDER — KETOROLAC TROMETHAMINE 30 MG/ML IJ SOLN
INTRAMUSCULAR | Status: DC | PRN
  Administered 2015-04-24: 30 mg via INTRAVENOUS

## 2015-04-24 MED ORDER — FENTANYL CITRATE (PF) 100 MCG/2ML IJ SOLN: 25.0000 ug | INTRAMUSCULAR | Status: DC | PRN

## 2015-04-24 MED ORDER — MAGNESIUM HYDROXIDE 400 MG/5ML PO SUSP
30.0000 mL | Freq: Every day | ORAL | Status: DC | PRN
Start: 2015-04-24 — End: 2015-04-27
  Administered 2015-04-25 – 2015-04-26 (×2): 30 mL via ORAL
  Filled 2015-04-24 (×2): qty 30

## 2015-04-24 MED ORDER — GABAPENTIN 300 MG PO CAPS
600.0000 mg | ORAL_CAPSULE | Freq: Three times a day (TID) | ORAL | Status: DC
  Administered 2015-04-24 – 2015-04-27 (×10): 600 mg via ORAL
  Filled 2015-04-24 (×10): qty 2

## 2015-04-24 MED ORDER — CEFAZOLIN SODIUM-DEXTROSE 2-3 GM-% IV SOLR
2.0000 g | Freq: Once | INTRAVENOUS | Status: AC
  Administered 2015-04-24: 2 g via INTRAVENOUS

## 2015-04-24 MED ORDER — NEOMYCIN-POLYMYXIN B GU 40-200000 IR SOLN
Status: AC
  Filled 2015-04-24: qty 20

## 2015-04-24 MED ORDER — MEPERIDINE HCL 25 MG/ML IJ SOLN
25.0000 mg | Freq: Once | INTRAMUSCULAR | Status: AC
  Administered 2015-04-24: 25 mg via INTRAVENOUS
  Filled 2015-04-24: qty 1

## 2015-04-24 MED ORDER — LACTATED RINGERS IV SOLN
INTRAVENOUS | Status: DC
  Administered 2015-04-24 (×3): via INTRAVENOUS

## 2015-04-24 MED ORDER — DOCUSATE SODIUM 100 MG PO CAPS
100.0000 mg | ORAL_CAPSULE | Freq: Two times a day (BID) | ORAL | Status: DC
  Administered 2015-04-24 – 2015-04-27 (×6): 100 mg via ORAL
  Filled 2015-04-24 (×7): qty 1

## 2015-04-24 MED ORDER — ONDANSETRON HCL 4 MG/2ML IJ SOLN
4.0000 mg | Freq: Four times a day (QID) | INTRAMUSCULAR | Status: DC | PRN
  Administered 2015-04-25: 4 mg via INTRAVENOUS
  Filled 2015-04-24: qty 2

## 2015-04-24 MED ORDER — BUPIVACAINE LIPOSOME 1.3 % IJ SUSP
INTRAMUSCULAR | Status: DC | PRN
  Administered 2015-04-24: 20 mL

## 2015-04-24 MED ORDER — MORPHINE SULFATE 4 MG/ML IJ SOLN
4.0000 mg | INTRAMUSCULAR | Status: DC | PRN
  Administered 2015-04-24 – 2015-04-26 (×11): 4 mg via INTRAVENOUS
  Filled 2015-04-24 (×11): qty 1

## 2015-04-24 MED ORDER — FENTANYL CITRATE (PF) 100 MCG/2ML IJ SOLN
INTRAMUSCULAR | Status: DC | PRN
  Administered 2015-04-24: 50 ug via INTRAVENOUS
  Administered 2015-04-24 (×2): 25 ug via INTRAVENOUS

## 2015-04-24 MED ORDER — SODIUM CHLORIDE 0.9 % IJ SOLN
INTRAMUSCULAR | Status: AC
  Filled 2015-04-24: qty 100

## 2015-04-24 MED ORDER — ACETAMINOPHEN 650 MG RE SUPP: 650.0000 mg | Freq: Four times a day (QID) | RECTAL | Status: DC | PRN

## 2015-04-24 MED ORDER — MORPHINE SULFATE 4 MG/ML IJ SOLN
INTRAMUSCULAR | Status: DC | PRN
  Administered 2015-04-24: 10 mg via INTRAVENOUS

## 2015-04-24 MED ORDER — FENTANYL CITRATE (PF) 100 MCG/2ML IJ SOLN
25.0000 ug | INTRAMUSCULAR | Status: DC | PRN
  Administered 2015-04-24 (×4): 25 ug via INTRAVENOUS

## 2015-04-24 MED ORDER — BISACODYL 10 MG RE SUPP: 10.0000 mg | Freq: Every day | RECTAL | Status: DC | PRN

## 2015-04-24 MED ORDER — PROMETHAZINE HCL 25 MG/ML IJ SOLN
12.5000 mg | Freq: Once | INTRAMUSCULAR | Status: AC
  Administered 2015-04-24: 12.5 mg via INTRAVENOUS
  Filled 2015-04-24: qty 1

## 2015-04-24 MED ORDER — BUPIVACAINE LIPOSOME 1.3 % IJ SUSP
INTRAMUSCULAR | Status: AC
  Filled 2015-04-24: qty 20

## 2015-04-24 MED ORDER — ENOXAPARIN SODIUM 30 MG/0.3ML ~~LOC~~ SOLN
30.0000 mg | Freq: Two times a day (BID) | SUBCUTANEOUS | Status: DC
  Administered 2015-04-25 – 2015-04-27 (×5): 30 mg via SUBCUTANEOUS
  Filled 2015-04-24 (×5): qty 0.3

## 2015-04-24 MED ORDER — MENTHOL 3 MG MT LOZG
1.0000 | LOZENGE | OROMUCOSAL | Status: DC | PRN
Start: 2015-04-24 — End: 2015-04-27
  Filled 2015-04-24: qty 9

## 2015-04-24 MED ORDER — OXYCODONE HCL 5 MG PO TABS
5.0000 mg | ORAL_TABLET | ORAL | Status: DC | PRN
  Administered 2015-04-24: 10 mg via ORAL
  Administered 2015-04-24: 5 mg via ORAL
  Administered 2015-04-24 – 2015-04-25 (×7): 10 mg via ORAL
  Administered 2015-04-26: 5 mg via ORAL
  Administered 2015-04-26 (×4): 10 mg via ORAL
  Administered 2015-04-26: 5 mg via ORAL
  Administered 2015-04-27 (×4): 10 mg via ORAL
  Filled 2015-04-24: qty 1
  Filled 2015-04-24 (×4): qty 2
  Filled 2015-04-24: qty 1
  Filled 2015-04-24 (×2): qty 2
  Filled 2015-04-24: qty 1
  Filled 2015-04-24 (×10): qty 2

## 2015-04-24 MED ORDER — CLONAZEPAM 1 MG PO TABS
1.0000 mg | ORAL_TABLET | Freq: Three times a day (TID) | ORAL | Status: DC | PRN
  Administered 2015-04-24 – 2015-04-27 (×8): 1 mg via ORAL
  Filled 2015-04-24 (×9): qty 1

## 2015-04-24 MED ORDER — MAGNESIUM CITRATE PO SOLN: 1.0000 | Freq: Once | ORAL | Status: AC | PRN

## 2015-04-24 MED ORDER — CEFAZOLIN SODIUM-DEXTROSE 2-3 GM-% IV SOLR
INTRAVENOUS | Status: AC
  Filled 2015-04-24: qty 50

## 2015-04-24 MED ORDER — PHENOL 1.4 % MT LIQD: 1.0000 | OROMUCOSAL | Status: DC | PRN

## 2015-04-24 MED ORDER — SODIUM CHLORIDE 0.9 % IV SOLN
INTRAVENOUS | Status: DC
  Administered 2015-04-24 (×2): via INTRAVENOUS

## 2015-04-24 MED ORDER — DIPHENHYDRAMINE HCL 12.5 MG/5ML PO ELIX: 12.5000 mg | ORAL_SOLUTION | ORAL | Status: DC | PRN

## 2015-04-24 MED ORDER — METOCLOPRAMIDE HCL 5 MG/ML IJ SOLN: 5.0000 mg | Freq: Three times a day (TID) | INTRAMUSCULAR | Status: DC | PRN

## 2015-04-24 MED ORDER — CEFAZOLIN SODIUM-DEXTROSE 2-3 GM-% IV SOLR
2.0000 g | Freq: Four times a day (QID) | INTRAVENOUS | Status: AC
  Administered 2015-04-24 (×3): 2 g via INTRAVENOUS
  Filled 2015-04-24 (×3): qty 50

## 2015-04-24 MED ORDER — ONDANSETRON HCL 4 MG PO TABS: 4.0000 mg | ORAL_TABLET | Freq: Four times a day (QID) | ORAL | Status: DC | PRN

## 2015-04-24 MED ORDER — MORPHINE (PF) INJECTION FOR INHALATION 10 MG/ML: RESPIRATORY_TRACT | Status: DC | PRN

## 2015-04-24 MED ORDER — METHADONE HCL 10 MG/ML PO CONC
60.0000 mg | Freq: Once | ORAL | Status: AC
  Administered 2015-04-24: 60 mg via ORAL
  Filled 2015-04-24: qty 6

## 2015-04-24 MED ORDER — BUPIVACAINE-EPINEPHRINE (PF) 0.25% -1:200000 IJ SOLN
INTRAMUSCULAR | Status: DC | PRN
  Administered 2015-04-24: 30 mL via PERINEURAL

## 2015-04-24 SURGICAL SUPPLY — 50 items
BANDAGE ELASTIC 6 CLIP ST LF (GAUZE/BANDAGES/DRESSINGS) ×3 IMPLANT
BLADE SAW 1 (BLADE) ×3 IMPLANT
BLOCK CUTTING FEMUR 3 LT MED (MISCELLANEOUS) IMPLANT
CANISTER SUCT 1200ML W/VALVE (MISCELLANEOUS) ×3 IMPLANT
CANISTER SUCT 3000ML (MISCELLANEOUS) ×6 IMPLANT
CAPT KNEE TOTAL 3 ×3 IMPLANT
CATH FOL LEG HOLDER (MISCELLANEOUS) ×3 IMPLANT
CATH TRAY 16F METER LATEX (MISCELLANEOUS) ×3 IMPLANT
CEMENT HV SMART SET (Cement) ×6 IMPLANT
CHLORAPREP W/TINT 26ML (MISCELLANEOUS) ×3 IMPLANT
COOLER POLAR GLACIER W/PUMP (MISCELLANEOUS) ×3 IMPLANT
DRAPE INCISE IOBAN 66X45 STRL (DRAPES) ×3 IMPLANT
DRAPE SHEET LG 3/4 BI-LAMINATE (DRAPES) ×6 IMPLANT
ELECT CAUTERY BLADE 6.4 (BLADE) ×3 IMPLANT
GAUZE PETRO XEROFOAM 1X8 (MISCELLANEOUS) ×3 IMPLANT
GAUZE SPONGE 4X4 12PLY STRL (GAUZE/BANDAGES/DRESSINGS) ×3 IMPLANT
GLOVE BIOGEL PI IND STRL 9 (GLOVE) ×6 IMPLANT
GLOVE BIOGEL PI INDICATOR 9 (GLOVE) ×12
GLOVE SURG ORTHO 9.0 STRL STRW (GLOVE) ×6 IMPLANT
GOWN SPECIALTY ULTRA XL (MISCELLANEOUS) ×3 IMPLANT
GOWN STRL REUS W/ TWL LRG LVL3 (GOWN DISPOSABLE) ×2 IMPLANT
GOWN STRL REUS W/TWL LRG LVL3 (GOWN DISPOSABLE) ×4
HANDPIECE SUCTION TUBG SURGILV (MISCELLANEOUS) ×3 IMPLANT
HOOD PEEL AWAY FACE SHEILD DIS (HOOD) ×6 IMPLANT
IMMBOLIZER KNEE 19 BLUE UNIV (SOFTGOODS) ×3 IMPLANT
IV SET EXTENSION 6 LL TADAPT (SET/KITS/TRAYS/PACK) IMPLANT
KNEE MEDACTA TIBIAL/FEMORAL BL (Knees) ×3 IMPLANT
KNIFE SCULPS 14X20 (INSTRUMENTS) ×3 IMPLANT
NDL SAFETY 18GX1.5 (NEEDLE) ×3 IMPLANT
NEEDLE SPNL 18GX3.5 QUINCKE PK (NEEDLE) ×3 IMPLANT
NEEDLE SPNL 20GX3.5 QUINCKE YW (NEEDLE) ×3 IMPLANT
NS IRRIG 1000ML POUR BTL (IV SOLUTION) ×3 IMPLANT
PACK TOTAL KNEE (MISCELLANEOUS) ×3 IMPLANT
PAD GROUND ADULT SPLIT (MISCELLANEOUS) ×3 IMPLANT
PAD WRAPON POLAR KNEE (MISCELLANEOUS) ×1 IMPLANT
SOL .9 NS 3000ML IRR  AL (IV SOLUTION) ×2
SOL .9 NS 3000ML IRR UROMATIC (IV SOLUTION) ×1 IMPLANT
STAPLER SKIN PROX 35W (STAPLE) ×3 IMPLANT
STEM EXTENSION 11MMX30MM (Stem) ×3 IMPLANT
STRAP SAFETY BODY (MISCELLANEOUS) ×3 IMPLANT
SUCTION FRAZIER TIP 10 FR DISP (SUCTIONS) ×3 IMPLANT
SUT DVC 2 QUILL PDO  T11 36X36 (SUTURE) ×2
SUT DVC 2 QUILL PDO T11 36X36 (SUTURE) ×1 IMPLANT
SUT DVC QUILL MONODERM 30X30 (SUTURE) ×3 IMPLANT
SUT ETHIBOND NAB CT1 #1 30IN (SUTURE) ×3 IMPLANT
SYR 20CC LL (SYRINGE) ×3 IMPLANT
SYR 50ML LL SCALE MARK (SYRINGE) ×3 IMPLANT
TOWER CARTRIDGE SMART MIX (DISPOSABLE) IMPLANT
WATER STERILE IRR 1000ML POUR (IV SOLUTION) ×3 IMPLANT
WRAPON POLAR PAD KNEE (MISCELLANEOUS) ×3

## 2015-04-24 NOTE — H&P (Signed)
Reviewed paper H+P, will be scanned into chart. No changes noted.  

## 2015-04-24 NOTE — Anesthesia Preprocedure Evaluation (Signed)
Anesthesia Evaluation  Patient identified by MRN, date of birth, ID band Patient awake    Reviewed: Allergy & Precautions, NPO status , Patient's Chart, lab work & pertinent test results  Airway Mallampati: I  TM Distance: >3 FB Neck ROM: Full    Dental  (+) Upper Dentures   Pulmonary Current Smoker,    + wheezing      Cardiovascular negative cardio ROS Normal cardiovascular exam    Neuro/Psych Anxiety Depression scoliosis    GI/Hepatic negative GI ROS, (+) Hepatitis -, C  Endo/Other  Hypothyroidism   Renal/GU   negative genitourinary   Musculoskeletal Avascular necrosis   Abdominal Normal abdominal exam  (+)   Peds negative pediatric ROS (+)  Hematology negative hematology ROS (+)   Anesthesia Other Findings   Reproductive/Obstetrics                             Anesthesia Physical Anesthesia Plan  ASA: III  Anesthesia Plan: Spinal   Post-op Pain Management:    Induction: Intravenous  Airway Management Planned: Nasal Cannula  Additional Equipment:   Intra-op Plan:   Post-operative Plan:   Informed Consent: I have reviewed the patients History and Physical, chart, labs and discussed the procedure including the risks, benefits and alternatives for the proposed anesthesia with the patient or authorized representative who has indicated his/her understanding and acceptance.   Dental advisory given  Plan Discussed with: CRNA and Surgeon  Anesthesia Plan Comments: (Pt has scoliosis... Will plan for spinal, but will do GOT if scoliosis is severe.)        Anesthesia Quick Evaluation

## 2015-04-24 NOTE — Progress Notes (Signed)
Natale states, "the medication is not touching my pain." PRN oxycodone and morphine given as often as ordered, would like something else for pain. Dr Ernest PineHooten, MD notified, 25mg  Demerol IV Once and 12.5mg  Phenergan IV Once ordered. Also discussed that Perian normally takes Methadone daily and is not ordered, Dr Ernest PineHooten, MD stated that he would pass it on to be addressed and for Nursing to pass it on and leave a note for it to be addressed.

## 2015-04-24 NOTE — Op Note (Signed)
04/24/2015  9:35 AM  PATIENT:  Kristin Orr  39 y.o. female  PRE-OPERATIVE DIAGNOSIS:  avascular necrosis  POST-OPERATIVE DIAGNOSIS:  avascular necrosis  PROCEDURE:  Procedure(s): TOTAL KNEE ARTHROPLASTY (Left)  SURGEON: Leitha SchullerMichael J Gurinder Toral, MD  ASSISTANTS: None  ANESTHESIA:   spinal  EBL:  Total I/O In: 1000 [I.V.:1000] Out: 500 [Urine:400; Blood:100]  BLOOD ADMINISTERED:none  DRAINS: none   LOCAL MEDICATIONS USED:  MARCAINE    and OTHER morphine Toradol and Exparel  SPECIMEN:  Source of Specimen:  Cut ends of bone left knee  DISPOSITION OF SPECIMEN:  PATHOLOGY  COUNTS:  YES  TOURNIQUET:   Total Tourniquet Time Documented: area (laterality) - 105197 minutes Total: area (laterality) - 105197 minutes   IMPLANTS: Medacta GMK sphere 3 femur, 2 tibia, 10 mm insert and size 2 patella, all components cemented  DICTATION: .Dragon Dictation patient was brought to the operating room and after adequate spinal anesthesia was obtained, the left leg was prepped and draped in sterile fashion was turned by the upper thigh. After prepping draping the sterile fashion appropriate patient education and timeout procedure completed. Tourniquet was raised to 300 mmHg and a midline skin incision was made. Medial parapatellar arthrotomy carried out and there was extensive synovitis present within the knee. The anterior cruciate ligament and fat pad were excised and the PCL cut. Proximal tibia was exposed to allow for application the Medacta proximal tibia cutting guide. It was pinned in place resection levels checked and proximal tibia cut carried out. This followed by identical procedure on the femur. The 3 femur cutting guide was applied to the distal femur and anterior posterior chamfer cuts carried out. This point residual meniscal horns are excised posteriorly and a 3 femur to tibia trials were placed with the keel punch in the tibia with a short stem after drilling proximal in the proximal tibia.  This 10 mm insert gave good stability and full extension. Patella was present the trials were removed after drilling the distal femur and the Cutting guide for the femoral trochlea was applied and router used. The patella was then exposed and a patellar cut carried out there was significant degenerative change to the patella. After resection and drilling it sized to fit well. All components removed and the knee irrigated exparel, morphine and Toradol infiltrated in the periarticular tissue for postop analgesia. Bony surfaces were thoroughly irrigated and dried and the components cemented in place first the tibia cemented first excess cement being removed. The 10 mm tibial insert placed and set with set screw followed by the femoral component revealed extension as the cement set patellar button was clamped into place. After cement set excess cement removed the knee was thoroughly irrigated first with a dilute Betadine solution in pulsatile lavage. Patella tracked well with no touch technique the arthrotomy was repaired using a heavy Quill suture deep to a Quill substantially and skin staples Xeroform 4 x 4's ABDs web roll Polar Care and Ace wrap applied patient center comes stable condition  PLAN OF CARE: Admit to inpatient   PATIENT DISPOSITION:  PACU - hemodynamically stable.

## 2015-04-24 NOTE — Transfer of Care (Signed)
Immediate Anesthesia Transfer of Care Note  Patient: Kristin Orr  Procedure(s) Performed: Procedure(s): TOTAL KNEE ARTHROPLASTY (Left)  Patient Location: PACU  Anesthesia Type:Spinal  Level of Consciousness: awake, alert  and oriented  Airway & Oxygen Therapy: Patient Spontanous Breathing and Patient connected to nasal cannula oxygen  Post-op Assessment: Report given to RN and Post -op Vital signs reviewed and stable  Post vital signs: Reviewed and stable  Last Vitals:  Filed Vitals:   04/24/15 0933  BP:   Pulse:   Temp: 37 C  Resp:     Complications: No apparent anesthesia complications

## 2015-04-24 NOTE — Anesthesia Procedure Notes (Addendum)
Spinal Patient location during procedure: OR Start time: 04/24/2015 7:25 AM End time: 04/24/2015 7:30 AM Staffing Anesthesiologist: Yves DillARROLL, PAUL Performed by: anesthesiologist  Preanesthetic Checklist Completed: patient identified, site marked, surgical consent, pre-op evaluation, timeout performed, IV checked, risks and benefits discussed and monitors and equipment checked Spinal Block Patient position: sitting Prep: Betadine Patient monitoring: heart rate, cardiac monitor, continuous pulse ox and blood pressure Approach: midline Location: L3-4 Injection technique: single-shot Needle Needle type: Whitacre  Needle gauge: 25 G Needle length: 9 cm Assessment Sensory level: T8 Additional Notes Spinal under sterile prep and drape.  Patient tolerated the procedure well.. Marked scoliosis.  Easy X 1

## 2015-04-25 LAB — CBC
HCT: 38 % (ref 35.0–47.0)
Hemoglobin: 12.2 g/dL (ref 12.0–16.0)
MCH: 30.7 pg (ref 26.0–34.0)
MCHC: 32 g/dL (ref 32.0–36.0)
MCV: 95.8 fL (ref 80.0–100.0)
Platelets: 407 10*3/uL (ref 150–440)
RBC: 3.96 MIL/uL (ref 3.80–5.20)
RDW: 13.4 % (ref 11.5–14.5)
WBC: 21.6 10*3/uL — ABNORMAL HIGH (ref 3.6–11.0)

## 2015-04-25 LAB — BASIC METABOLIC PANEL
ANION GAP: 8 (ref 5–15)
BUN: 9 mg/dL (ref 6–20)
CO2: 30 mmol/L (ref 22–32)
CREATININE: 0.65 mg/dL (ref 0.44–1.00)
Calcium: 8.9 mg/dL (ref 8.9–10.3)
Chloride: 101 mmol/L (ref 101–111)
GFR calc Af Amer: >60 mL/min (ref >60)
GFR calc non Af Amer: >60 mL/min (ref >60)
Glucose, Bld: 109 mg/dL — ABNORMAL HIGH (ref 65–99)
Potassium: 3.5 mmol/L (ref 3.5–5.1)
SODIUM: 139 mmol/L (ref 135–145)

## 2015-04-25 LAB — ABO/RH: ABO/RH(D): O POS

## 2015-04-25 MED ORDER — CELECOXIB 200 MG PO CAPS
200.0000 mg | ORAL_CAPSULE | Freq: Two times a day (BID) | ORAL | Status: DC
  Administered 2015-04-25 – 2015-04-27 (×4): 200 mg via ORAL
  Filled 2015-04-25 (×4): qty 1

## 2015-04-25 MED ORDER — METHADONE HCL 10 MG PO TABS
60.0000 mg | ORAL_TABLET | Freq: Every day | ORAL | Status: DC
  Administered 2015-04-25 – 2015-04-27 (×3): 60 mg via ORAL
  Filled 2015-04-25 (×3): qty 6

## 2015-04-25 NOTE — Progress Notes (Signed)
Physical Therapy Treatment Patient Details Name: Kristin Orr MRN: [id number] DOB: [birth year] Today's Date: 04/25/2015    History of Present Illness This patient is a 39 year old female who [facility] a L TKR. She had her right knee replaced in 2014. 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     PT Comments    Patient continues to display very high levels of anxiety and fear of pain. She requires max encouragement and breaks during ambulation to complete 30' of ambulation with RW. She does increase her weight bearing on her LLE during ambulation today, and responds very well when asked about her children. Patient is currently limited primarily by pain, as she demonstrates relatively safe and efficient ambulation with RW. She is independent with bed mobility and does not require any physical assistance for sit <--> stand transfers. Patient now saying she will need to complete 5 steps to enter the house, as she has had a recent change in living situations? Patient will continue to benefit from skilled PT to address her mobility deficits. At this time given her complaints of pain, she is not able to safely navigate her home environment. PT will continue to progress as tolerated.   Follow Up Recommendations  SNF     Equipment Recommendations  Rolling walker with 5" wheels;3in1 (PT)    Recommendations for Other Services       Precautions / Restrictions Precautions Precautions: Knee Precaution Comments: Knee immobilizer on until 10 SLRs completed.  Required Braces or Orthoses: Knee Immobilizer - Left Knee Immobilizer - Left: On at all times Restrictions Weight Bearing Restrictions: Yes LLE Weight Bearing: Weight bearing as tolerated    Mobility  Bed Mobility Overal bed mobility: Independent             General bed mobility comments: Patient displays foot under ankle technique to complete bed mobility.   Transfers Overall transfer  level: Needs assistance Equipment used: Rolling walker (2 wheeled) Transfers: Sit to/from Stand Sit to Stand: Supervision         General transfer comment: Patient is much less impulsive during this session and waits for PT to bring walker towards her. Patient is able to transfer independently safely during this session.   Ambulation/Gait Ambulation/Gait assistance: Min guard Ambulation Distance (Feet): 30 Feet Assistive device: Rolling walker (2 wheeled)     Gait velocity interpretation: <1.8 ft/sec, indicative of risk for recurrent falls General Gait Details: Patient ambulates with step to gait pattern with LLE advancing first. She does weight bear through LLE, though she needs multiple rest breaks. Once PT asked about her children, patient ambulated much more quickly with no complaints of pain.    Stairs            Wheelchair Mobility    Modified Rankin (Stroke Patients Only)       Balance Overall balance assessment: Needs assistance Sitting-balance support: Feet supported Sitting balance-Leahy Scale: Good     Standing balance support: Bilateral upper extremity supported Standing balance-Leahy Scale: Fair Standing balance comment: Patient displays no loss of balance with RW and weight shifting. She weight bears through her LLE and does not have any loss of balance.                     Cognition Arousal/Alertness: Awake/alert Behavior During Therapy: WFL for tasks assessed/performed;Anxious Overall Cognitive Status: Within Functional Limits for tasks assessed  Exercises Total Joint Exercises Ankle Circles/Pumps: AROM;Both;20 reps Heel Slides: AAROM;10 reps;AROM;Right Straight Leg Raises: AROM;Right;10 reps    General Comments        Pertinent Vitals/Pain Pain Assessment: 0-10 Pain Score:  (Patient did not rate pain 0-10, but whimpered throughout session. Patient was pre-medicated and continued to ask for pain  medication during session. ) Pain Location: Left knee  Pain Descriptors / Indicators: Aching;Crying Pain Intervention(s): Limited activity within patient's tolerance;Monitored during session;Premedicated before session;Patient requesting pain meds-RN notified;Utilized relaxation techniques;Ice applied    Home Living                      Prior Function            PT Goals (current goals can now be found in the care plan section)      Frequency  BID    PT Plan Current plan remains appropriate    Co-evaluation             End of Session Equipment Utilized During Treatment: Gait belt Activity Tolerance: Patient limited by pain Patient left: with call bell/phone within reach;with bed alarm set;in bed     Time: 1535-1600 PT Time Calculation (min) (ACUTE ONLY): 25 min  Charges:  $Gait Training: 8-22 mins $Therapeutic Exercise: 8-22 mins                    G Codes:      Kerin Ransom, PT, DPT    04/25/2015, 5:13 PM

## 2015-04-25 NOTE — Plan of Care (Signed)
Problem: Consults Goal: Diagnosis- Total Joint Replacement Outcome: Completed/Met Date Met:  04/25/15 Primary Total Knee
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
## 2015-04-25 NOTE — Progress Notes (Signed)
   Subjective: 1 Day Post-Op Procedure(s) (LRB): TOTAL KNEE ARTHROPLASTY (Left) Patient reports pain as 10 on 0-10 scale.   Patient is having problems with pain in the left, requiring pain medications We will start therapy today.  Plan is to go Home after hospital stay.  Objective: Vital signs in last 24 hours: Temp:  [97.7 F (36.5 C)-98.7 F (37.1 C)] 98.7 F (37.1 C) (07/13 0349) Pulse Rate:  [61-96] 87 (07/13 0349) Resp:  [16-18] 18 (07/13 0349) BP: (86-128)/(40-98) 128/98 mmHg (07/13 0349) SpO2:  [94 %-100 %] 100 % (07/13 0349) FiO2 (%):  [21 %] 21 % (07/12 1035) Weight:  [141.522 kg (312 lb)] 141.522 kg (312 lb) (07/13 0500)  Intake/Output from previous day: 07/12 0701 - 07/13 0700 In: 2465 [P.O.:480; I.V.:1835; IV Piggyback:150] Out: 3100 [Urine:3000; Blood:100] Intake/Output this shift:     Recent Labs  04/24/15 1046  HGB 12.5    Recent Labs  04/24/15 1046  WBC 7.7  RBC 3.97  HCT 38.0  PLT 326    Recent Labs  04/24/15 1046  CREATININE 0.72   No results for input(s): LABPT, INR in the last 72 hours.  EXAM General - Patient is alert and oriented x 3. Pt sleeping upon entering room. Extremity - Neurovascular intact Sensation intact distally Intact pulses distally Dorsiflexion/Plantar flexion intact  - homans sign  Dressing - dressing C/D/I Motor Function - intact, moving foot and toes well on exam. Unable to straight leg raise  Past Medical History  Diagnosis Date  . Hypothyroidism   . Depression   . Anxiety   . Hepatitis     Hepatitis C  . Restless leg syndrome   . Scoliosis   . Avascular necrosis of bone     Assessment/Plan:   1 Day Post-Op Procedure(s) (LRB): TOTAL KNEE ARTHROPLASTY (Left) Active Problems:   Avascular necrosis of medial femoral condyle  Estimated body mass index is 48.85 kg/(m^2) as calculated from the following:   Height as of this encounter: 5\' 7"  (1.702 m).   Weight as of this encounter: 141.522 kg (312  lb). Advance diet Up with therapy  Needs BM before discharge Continue with home medication, methadone 60mg  daily Continue to monitor pain    DVT Prophylaxis - Lovenox, Foot Pumps and TED hose Weight-Bearing as tolerated to left leg D/C O2 and Pulse OX and try on Room Air  T. Cranston Neighborhris Evelina Lore, PA-C [facility] 04/25/2015, 7:23 AM

## 2015-04-25 NOTE — Clinical Social Work Placement (Signed)
   CLINICAL SOCIAL WORK PLACEMENT  NOTE  Date:  04/25/2015  Patient Details  Name: Marguetta Greer PickerelD Laver MRN: [id number] Date of Birth: 1975/12/12  Clinical Social Work is seeking post-discharge placement for this patient at the Skilled  Nursing Facility level of care (*CSW will initial, date and re-position this form in  chart as items are completed):  Yes   Patient/family provided with [place] Clinical Social Work Department's list of facilities offering this level of care within the geographic area requested by the patient (or if unable, by the patient's family).  Yes   Patient/family informed of their freedom to choose among providers that offer the needed level of care, that participate in Medicare, Medicaid or managed care program needed by the patient, have an available bed and are willing to accept the patient.  Yes   Patient/family informed of [place]'s ownership interest in [facility] and [facility], as well as of the fact that they are under no obligation to receive care at these facilities.  PASRR submitted to EDS on 04/25/15     PASRR number received on 04/25/15     Existing PASRR number confirmed on       FL2 transmitted to all facilities in geographic area requested by pt/family on 04/25/15     FL2 transmitted to all facilities within larger geographic area on       Patient informed that his/her managed care company has contracts with or will negotiate with certain facilities, including the following:            Patient/family informed of bed offers received.  Patient chooses bed at       Physician recommends and patient chooses bed at      Patient to be transferred to   on  .  Patient to be transferred to facility by       Patient family notified on   of transfer.  Name of family member notified:        PHYSICIAN Please sign FL2     Additional Comment:    _______________________________________________ Haig ProphetMorgan, Alanah Sakuma G, LCSW 04/25/2015, 11:09  AM

## 2015-04-25 NOTE — Progress Notes (Signed)
   04/25/15 1837  Clinical Encounter Type  Visited With Patient  Visit Type Follow-up  Referral From Chaplain  Consult/Referral To Chaplain  Spiritual Encounters  Spiritual Needs Other (Comment)  Stress Factors  Patient Stress Factors None identified  Chaplain conducted a follow-up visit with the patient. Patient appeared to be groggy so Chaplain will try to follow-up as applicable. Chaplain Justeen Hehr A. Pippa Hanif Ext. [phone]

## 2015-04-25 NOTE — Anesthesia Postprocedure Evaluation (Signed)
  Anesthesia Post-op Note  Patient: Kristin Orr  Procedure(s) Performed: Procedure(s): TOTAL KNEE ARTHROPLASTY (Left)  Anesthesia type:Spinal  Patient location: 141  Post pain: Pain level controlled  Post assessment: Post-op Vital signs reviewed, Patient's Cardiovascular Status Stable, Respiratory Function Stable, Patent Airway and No signs of Nausea or vomiting  Post vital signs: Reviewed and stable  Last Vitals:  Filed Vitals:   04/25/15 0735  BP: 122/78  Pulse: 90  Temp: 36.8 C  Resp: 20    Level of consciousness: awake, alert  and patient cooperative  Complications: No apparent anesthesia complications

## 2015-04-25 NOTE — Evaluation (Signed)
Occupational Therapy Evaluation Patient Details Name: Kristin Orr MRN: [id number] DOB: [birth year] Today's Date: 04/25/2015    History of Present Illness This patient is a 39 year old female who came to [facility] for a L TKR. She had her right knee replaced in 2014.   Clinical Impression   This patient is a 40 year old female who came to [facility] for a left total knee replacement.  Patient lives in a one story mobil home with 5 and 10 steps to enter depending on entrance.  She had been independent with ADL and functional mobility. She now requires  assistance for lower body dressing. Patient practiced lower body dressing using hip kit with minimal assist.      Follow Up Recommendations       Equipment Recommendations       Recommendations for Other Services       Precautions / Restrictions Precautions Precautions: Knee Restrictions Weight Bearing Restrictions: No      Mobility Bed Mobility Overal bed mobility: Modified Independent                Transfers Overall transfer level:  (Contact guard assist with heavy cues for safety)                    Balance                                            ADL                                         General ADL Comments: Had been independent with ADL. She now needs minimal assist for lower body dressing.     Vision     Perception     Praxis      Pertinent Vitals/Pain Pain Assessment: 0-10 Pain Score: 6  (meds given)     Hand Dominance     Extremity/Trunk Assessment Upper Extremity Assessment Upper Extremity Assessment:  (B UE WFL)           Communication Communication Communication: No difficulties   Cognition Arousal/Alertness: Awake/alert Behavior During Therapy: WFL for tasks assessed/performed Overall Cognitive Status: Within Functional Limits for tasks assessed                     General Comments       Exercises        Shoulder Instructions      Home Living Family/patient expects to be discharged to:: Private residence Living Arrangements: Spouse/significant other   Type of Home: Mobile home       Home Layout: One level     Bathroom Shower/Tub: Tub/shower unit                    Prior Functioning/Environment Level of Independence: Independent             OT Diagnosis: Acute pain   OT Problem List:     OT Treatment/Interventions: Self-care/ADL training    OT Goals(Current goals can be found in the care plan section) Acute Rehab OT Goals Patient Stated Goal: To go home Time For Goal Achievement: 05/09/15 Potential to Achieve Goals: Good  OT Frequency: Min 1X/week   Barriers to D/C:  Co-evaluation              End of Session Equipment Utilized During Treatment: Gait belt (hip kit)  Activity Tolerance: Patient tolerated treatment well Patient left: in bed;with call bell/phone within reach;with bed alarm set   Time: 9628-3662 OT Time Calculation (min): 20 min Charges:  OT General Charges $OT Visit: 1 Procedure OT Evaluation $Initial OT Evaluation Tier I: 1 Procedure OT Treatments $Self Care/Home Management : 8-22 mins G-Codes:    Myrene Galas, MS/OTR/L  04/25/2015, 10:02 AM

## 2015-04-25 NOTE — Anesthesia Post-op Follow-up Note (Signed)
  Anesthesia Pain Follow-up Note  Patient: Kristin Orr  Day #: 1  Date of Follow-up: 04/25/2015 Time: 7:43 AM  Last Vitals:  Filed Vitals:   04/25/15 0735  BP: 122/78  Pulse: 90  Temp: 36.8 C  Resp: 20    Level of Consciousness: alert  Pain: mild   Side Effects:None  Catheter Site Exam:clean, dry, no drainage  Plan: D/C from anesthesia care  Chong SicilianLopez,  Alden Bensinger

## 2015-04-25 NOTE — Clinical Social Work Note (Signed)
Clinical Social Work Assessment  Patient Details  Name: Kristin Orr MRN: [id number] Date of Birth: 1976-09-04  Date of referral:  04/25/15               Reason for consult:  Facility Placement, Care Management Concerns                Permission sought to share information with:  Facility Sport and exercise psychologist, Case Optician, dispensing granted to share information::  Yes, Verbal Permission Granted  Name::      [place]::     Relationship::     Contact Information:     Housing/Transportation Living arrangements for the past 2 months:  [place] of Information:  Patient Patient Interpreter Needed:  None Criminal Activity/Legal Involvement Pertinent to Current Situation/Hospitalization:  No - Comment as needed Significant Relationships:  Dependent Children Lives with:  Minor Children, Parents Do you feel safe going back to the place where you live?  Yes Need for family participation in patient care:  Yes (Comment)  Care giving concerns:  Patient lives in [place] with Kristin Orr 2 y.o son and Orr.    Social Worker assessment / plan: Holiday representative (CSW) received SNF consult. PT is recommending SNF today due to limitation with pain. CSW met with patient to address consult. Patient was laying in the bed asleep. CSW introduced self and explained role of CSW department. Patient appeared drowsy and not oriented at times. Patient reported that she lives with Kristin Orr 37 y.o son and 39 y.o Orr in [place]. CSW asked what patient's Orr's age was a second time and patient replied that Kristin Orr "just turned 39". Patient reported that Kristin Orr and Kristin Orr broke up today. 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 Patient reported that she does not receive SSI and has reapplied for disability.   CSW explained that PT is recommending SNF. CSW explained SNF process with Medicaid. Patient reported that she was not willing to  stay for 30 days at [facility] and did not want to go outside of [facility]. CSW explained that a Mediciad bed will likely be found outside of [facility]. Patient reported that she prefers to return home. RN Case Manager aware of above.   FL2 complete and faxed out. CSW contacted Jorje Guild RN liaison for [facility] Per Stanton Kidney they will not be able to take a Medicaid patient without disability because patient will not qualify for long term care Medicaid. CSW will continue to follow and assist as needed.   Employment status:  Unemployed Forensic scientist:  Medicaid In [place] PT Recommendations:  [place] / Referral to community resources:  [place]  Patient/Family's Response to care:  Patient wants to go home.   Patient/Family's Understanding of and Emotional Response to Diagnosis, Current Treatment, and Prognosis:  Patient appeared drowsy and confused at times during assessment. Patient started talking about Kristin Orr cell phone contacts being deleted when CSW was discussing SNF. Patient prefers to go home.   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

## 2015-04-25 NOTE — Progress Notes (Signed)
Pt asking for pain meds even though her eyes are closed and she is not able to complete sentence without zoning out . Pt wants morphine dose increased, when morphine was given pt stated she" did not feel it go in"  and tried to peel off transparent dressing over saline lock stating it"s not in , even though saline lock was in place and flushed and had positive blood return and morphine was given iv, . Nurse educated pt on pain meds ordered and times she could have pain  meds .

## 2015-04-25 NOTE — Care Management Note (Addendum)
Case Management Note  Patient Details  Name: Kristin Orr MRN: [id number] Date of Birth: [birth year]  Subjective/Objective:                   Met with patient who was crying when I entered the room; requesting Methadone for pain control. PT stated that patient weight shows ~300 lbs and questioned accuracy (RN notified along with nursing student). Patient wants to go to SNF; she is Medicaid and will require 30 day minimum. PT is recommending SNF at this time due to pain/anxiety. She states she lives with her "48 year old mother that has cancer". She has not DME at home. I do not remember this patient in joint class to prepare of orthopedic surgery. Patient uses [facility] for Rx  [phone].   Action/Plan:  List of home health agencies provided to patient. Explained 30-day requirement for Medicaid/SNF to patient. Lovenox (brand only) 48m #14 called in to [facility]. [facility] [place] [facility]   Expected Discharge Date:                  Expected Discharge Plan:     In-House Referral:  Clinical Social Work  Discharge planning Services  CM Consult  Post Acute Care Choice:    Choice offered to:  Patient  DME Arranged:    DME Agency:     HH Arranged:    [facility]:     Status of Service:     Medicare Important Message Given:    Date Medicare IM Given:    Medicare IM give by:    Date Additional Medicare IM Given:    Additional Medicare Important Message give by:     If discussed at [place] Meetings, dates discussed:    Additional Comments: Patient seems/appears more comfortable today. She wants to return home at discharge. RNCM to notify [facility] on discharge to obtain Lovenox which they had to order because brand name only was covered by Medicaid. Patient states she had a rolling walker 2 years ago but "threw it away". I explained that Medicaid will only cover equipment every 5 years. She will have her mother look for  cheaper shower chair and rolling walker. I will check with [facility] to see if patient's Medicaid will pay for DME.  AMarshell Garfinkel RN 04/25/2015, 10:06 AM

## 2015-04-26 LAB — CBC WITH DIFFERENTIAL/PLATELET
Basophils Absolute: 0.1 10*3/uL (ref 0–0.1)
Basophils Relative: 1 %
Eosinophils Absolute: 0.1 10*3/uL (ref 0–0.7)
Eosinophils Relative: 1 %
HCT: 37.2 % (ref 35.0–47.0)
HEMOGLOBIN: 11.9 g/dL — AB (ref 12.0–16.0)
Lymphocytes Relative: 30 %
Lymphs Abs: 3.3 10*3/uL (ref 1.0–3.6)
MCH: 30.7 pg (ref 26.0–34.0)
MCHC: 32 g/dL (ref 32.0–36.0)
MCV: 96 fL (ref 80.0–100.0)
MONO ABS: 1.2 10*3/uL — AB (ref 0.2–0.9)
MONOS PCT: 11 %
Neutro Abs: 6.6 10*3/uL — ABNORMAL HIGH (ref 1.4–6.5)
Neutrophils Relative %: 59 %
Platelets: 333 10*3/uL (ref 150–440)
RBC: 3.88 MIL/uL (ref 3.80–5.20)
RDW: 13.5 % (ref 11.5–14.5)
WBC: 11.2 10*3/uL — ABNORMAL HIGH (ref 3.6–11.0)

## 2015-04-26 LAB — SURGICAL PATHOLOGY

## 2015-04-26 MED ORDER — ENOXAPARIN SODIUM 40 MG/0.4ML ~~LOC~~ SOLN: 40.0000 mg | SUBCUTANEOUS | Status: DC

## 2015-04-26 MED ORDER — OXYCODONE HCL 5 MG PO TABS: 5.0000 mg | ORAL_TABLET | ORAL | Status: DC | PRN

## 2015-04-26 MED ORDER — CELECOXIB 200 MG PO CAPS: 200.0000 mg | ORAL_CAPSULE | Freq: Two times a day (BID) | ORAL | Status: DC

## 2015-04-26 NOTE — Progress Notes (Signed)
   Subjective: 2 Days Post-Op Procedure(s) (LRB): TOTAL KNEE ARTHROPLASTY (Left) Patient reports pain as moderate Patient is doing well with no complaints. Slept well last night. We will continue with therapy today.  Plan is to go SNF  Objective: Vital signs in last 24 hours: Temp:  [98.2 F (36.8 C)-100.7 F (38.2 C)] 99.1 F (37.3 C) (07/14 0450) Pulse Rate:  [70-97] 82 (07/14 0450) Resp:  [18-20] 18 (07/14 0450) BP: (122-137)/(70-78) 137/72 mmHg (07/14 0450) SpO2:  [98 %-100 %] 98 % (07/14 0450)  Intake/Output from previous day: 07/13 0701 - 07/14 0700 In: 840 [P.O.:840] Out: 1925 [Urine:1925] Intake/Output this shift:     Recent Labs  04/24/15 1046 04/25/15 0716  HGB 12.5 12.2    Recent Labs  04/24/15 1046 04/25/15 0716  WBC 7.7 21.6*  RBC 3.97 3.96  HCT 38.0 38.0  PLT 326 407    Recent Labs  04/24/15 1046 04/25/15 0716  NA  --  139  K  --  3.5  CL  --  101  CO2  --  30  BUN  --  9  CREATININE 0.72 0.65  GLUCOSE  --  109*  CALCIUM  --  8.9   No results for input(s): LABPT, INR in the last 72 hours.  EXAM General - Patient is alert and oriented x 3. Pt sleeping upon entering room. Extremity - Neurovascular intact Sensation intact distally Intact pulses distally Dorsiflexion/Plantar flexion intact  - homans sign  Dressing - dressing C/D/I, changed to honeycomb Motor Function - intact, moving foot and toes well on exam. Unable to straight leg raise  Past Medical History  Diagnosis Date  . Hypothyroidism   . Depression   . Anxiety   . Hepatitis     Hepatitis C  . Restless leg syndrome   . Scoliosis   . Avascular necrosis of bone     Assessment/Plan:   2 Days Post-Op Procedure(s) (LRB): TOTAL KNEE ARTHROPLASTY (Left) Active Problems:   Avascular necrosis of medial femoral condyle  Estimated body mass index is 48.85 kg/(m^2) as calculated from the following:   Height as of this encounter: 5\' 7"  (1.702 m).   Weight as of this  encounter: 141.522 kg (312 lb). Advance diet Up with therapy  Needs BM before discharge Recheck CBC this am, possible WBC error  DVT Prophylaxis - Lovenox, Foot Pumps and TED hose Weight-Bearing as tolerated to left leg D/C O2 and Pulse OX and try on Room Air  T. Cranston Neighborhris Gaines, PA-C [facility] 04/26/2015, 7:10 AM

## 2015-04-26 NOTE — Progress Notes (Signed)
Physical Therapy Treatment Patient Details Name: Kristin Orr MRN: [id number] DOB: [birth year] Today's Date: 04/26/2015    History of Present Illness This patient is a 39 year old female [facility]. She had her right knee replaced in 2014. 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     PT Comments    Patient initially found out of bed on her way to the bathroom by herself, and displays intermittent emotional episodes throughout ambulation today. She does seem to tolerate weight bearing and ambulation better today with no loss of balance. She appears safe with household ambulation given her UE strength and mobility, however she will need constant supervision as she is extremely emotionally labile and impulsive. Patient tolerating there-ex moreso today with increased ROM tolerated, though this appears to continue to be limited by muscle guarding. Patient would benefit from continued skilled physical therapy services to address her ROM, strength, and balance deficits.   Follow Up Recommendations  Home health PT;Supervision for mobility/OOB;Supervision/Assistance - 24 hour     Equipment Recommendations  Rolling walker with 5" wheels;3in1 (PT)    Recommendations for Other Services       Precautions / Restrictions Precautions Precautions: Knee Precaution Comments: Knee immobilizer on until 10 SLRs completed.  Required Braces or Orthoses: Knee Immobilizer - Left Knee Immobilizer - Left: On at all times Restrictions Weight Bearing Restrictions: Yes LLE Weight Bearing: Weight bearing as tolerated    Mobility  Bed Mobility Overal bed mobility: Independent             General bed mobility comments: Patient displays foot under ankle technique to complete bed mobility.   Transfers Overall transfer level: Modified independent Equipment used: Rolling walker (2 wheeled) Transfers: Sit to/from Stand Sit to Stand: Modified  independent (Device/Increase time)         General transfer comment: PT heard bed alarm going off in room, and entered to find patient standing with RW on her way to the bathroom.   Ambulation/Gait Ambulation/Gait assistance: Supervision Ambulation Distance (Feet): 150 Feet Assistive device: Rolling walker (2 wheeled) Gait Pattern/deviations: Decreased step length - left;Decreased step length - right;Decreased stance time - left   Gait velocity interpretation: <1.8 ft/sec, indicative of risk for recurrent falls General Gait Details: Patient continues to have emotional fits during gait, though this does not seem to be related to weight bearing. Patient prefers smaller stride lengths, and relies heavily on her UEs with RW for WBing while on LLE. Patient encouraged to have continuous movement from RW, rather than starting and stopping.    Stairs            Wheelchair Mobility    Modified Rankin (Stroke Patients Only)       Balance Overall balance assessment: Needs assistance Sitting-balance support: Feet unsupported Sitting balance-Leahy Scale: Good       Standing balance-Leahy Scale: Fair Standing balance comment: Patient is able to wash her hands and stand independently without RW.                     Cognition Arousal/Alertness: Awake/alert Behavior During Therapy: WFL for tasks assessed/performed;Anxious Overall Cognitive Status: Within Functional Limits for tasks assessed                      Exercises Total Joint Exercises Heel Slides: AAROM;10 reps;AROM;Right Straight Leg Raises: AROM;10 reps;Both Goniometric ROM: 12-60 degrees of left knee flexion, pain  limited still. Patient appears to have additional ROM, but continues to muscle guard.     General Comments        Pertinent Vitals/Pain Pain Assessment: Faces Faces Pain Scale: Hurts worst Pain Location: Left knee  Pain Descriptors / Indicators: Aching;Crying Pain Intervention(s):  Limited activity within patient's tolerance;Monitored during session;Patient requesting pain meds-RN notified    Home Living                      Prior Function            PT Goals (current goals can now be found in the care plan section) Acute Rehab PT Goals Patient Stated Goal: To go home PT Goal Formulation: With patient Time For Goal Achievement: 05/09/15 Potential to Achieve Goals: Good Progress towards PT goals: Progressing toward goals    Frequency  BID    PT Plan Current plan remains appropriate    Co-evaluation             End of Session Equipment Utilized During Treatment: Gait belt Activity Tolerance: Patient limited by pain Patient left: in bed;with call bell/phone within reach;with nursing/sitter in room     Time: 4098-1191 PT Time Calculation (min) (ACUTE ONLY): 27 min  Charges:  $Gait Training: 8-22 mins $Therapeutic Exercise: 8-22 mins                    G Codes:      Kerin Ransom, PT, DPT    04/26/2015, 10:08 AM

## 2015-04-26 NOTE — Discharge Summary (Signed)
Physician Discharge Summary  Patient ID: Kristin Orr MRN: [id number] DOB/AGE: 1976-09-11 39 y.o.  Admit date: 04/24/2015 Discharge date: 04/26/2015  Admission Diagnoses:  DEGENERATIVE OA    Discharge Diagnoses: Patient Active Problem List   Diagnosis Date Noted  . Avascular necrosis of medial femoral condyle 04/24/2015    Past Medical History  Diagnosis Date  . Hypothyroidism   . Depression   . Anxiety   . Hepatitis     Hepatitis C  . Restless leg syndrome   . Scoliosis   . Avascular necrosis of bone      Transfusion: none   Consultants (if any):    Discharged Condition: Improved  Hospital Course: Kristin Orr is an 39 y.o. female who was admitted 04/24/2015 with a diagnosis of left knee AVN and went to the operating room on 04/24/2015 and underwent the above named procedures.    Surgeries: Procedure(s): TOTAL KNEE ARTHROPLASTY on 04/24/2015 Patient tolerated the surgery well. Taken to PACU where she was stabilized and then transferred to the orthopedic floor.  Started on Lovenox 30 q 12 hrs. Foot pumps applied bilaterally at 80 mm. Heels elevated on bed with rolled towels. No evidence of DVT. Negative Homan. Physical therapy started on day #1 for gait training and transfer. OT started day #1 for ADL and assisted devices.  Patient's foley was d/c on day #1. Patient's IV and hemovac was d/c on day #2.  On post op day #3 patient was stable and ready for discharge to home with home health PT.  Implants: Medacta GMK sphere 3 femur, 2 tibia, 10 mm insert and size 2 patella, all components cemented  She was given perioperative antibiotics:  Anti-infectives    Start     Dose/Rate Route Frequency Ordered Stop   04/24/15 1400  ceFAZolin (ANCEF) IVPB 2 g/50 mL premix     2 g 100 mL/hr over 30 Minutes Intravenous Every 6 hours 04/24/15 1034 04/25/15 0002   04/24/15 0630  ceFAZolin (ANCEF) IVPB 2 g/50 mL premix     2 g 100 mL/hr over 30 Minutes Intravenous  Once 04/24/15  0623 04/24/15 0751   04/24/15 0559  ceFAZolin (ANCEF) 2-3 GM-% IVPB SOLR    Comments:  HENRY, LOIS: cabinet override      04/24/15 0559 04/24/15 1759    .  She was given sequential compression devices, early ambulation, and lovenox for DVT prophylaxis.  She benefited maximally from the hospital stay and there were no complications.    Recent vital signs:  Filed Vitals:   04/26/15 0745  BP: 125/73  Pulse: 91  Temp: 99 F (37.2 C)  Resp: 16    Recent laboratory studies:  Lab Results  Component Value Date   HGB 11.9* 04/26/2015   HGB 12.2 04/25/2015   HGB 12.5 04/24/2015   Lab Results  Component Value Date   WBC 11.2* 04/26/2015   PLT 333 04/26/2015   Lab Results  Component Value Date   INR 0.95 04/18/2015   Lab Results  Component Value Date   NA 139 04/25/2015   K 3.5 04/25/2015   CL 101 04/25/2015   CO2 30 04/25/2015   BUN 9 04/25/2015   CREATININE 0.65 04/25/2015   GLUCOSE 109* 04/25/2015    Discharge Medications:     Medication List    TAKE these medications        celecoxib 200 MG capsule  Commonly known as:  CELEBREX  Take 1 capsule (200 mg total) by mouth 2 (  two) times daily.     clonazePAM 1 MG tablet  Commonly known as:  KLONOPIN  Take 1 mg by mouth 3 (three) times daily as needed for anxiety.     enoxaparin 40 MG/0.4ML injection  Commonly known as:  LOVENOX  Inject 0.4 mLs (40 mg total) into the skin daily.     gabapentin 300 MG capsule  Commonly known as:  NEURONTIN  Take 600 mg by mouth 3 (three) times daily.     ibuprofen 800 MG tablet  Commonly known as:  ADVIL,MOTRIN  Take 800 mg by mouth every 8 (eight) hours as needed.     methadone 10 MG/ML solution  Commonly known as:  DOLOPHINE  Take 60 mg by mouth once.     oxyCODONE 5 MG immediate release tablet  Commonly known as:  Oxy IR/ROXICODONE  Take 1-2 tablets (5-10 mg total) by mouth every 3 (three) hours as needed for breakthrough pain.     traMADol 50 MG tablet  Commonly  known as:  ULTRAM  Take 50 mg by mouth every 6 (six) hours as needed.        Diagnostic Studies: Dg Knee 1-2 Views Left  04/24/2015   CLINICAL DATA:  Postop from left knee arthroplasty.  EXAM: LEFT KNEE - 1-2 VIEW  COMPARISON:  None.  FINDINGS: Total knee arthroplasty is seen, with all 3 components in expected position. No evidence of fracture or dislocation. Intra-articular gas noted as well as overlying skin staples.  IMPRESSION: Expected appearance status post total knee arthroplasty. No evidence of fracture or other complication.   Electronically Signed   By: Myles RosenthalJohn  Stahl M.D.   On: 04/24/2015 09:53    Disposition: home with home health PT        Follow-up Information    Follow up with MENZ,MICHAEL, MD In 2 weeks.   Specialty:  Orthopedic Surgery   Why:  For staple removal and skin check   Contact information:   [address] [place] [address] [phone]        Signed: Patience MuscaGAINES, Tillmon Kisling CHRISTOPHER 04/26/2015, 12:10 PM

## 2015-04-26 NOTE — Progress Notes (Signed)
Gave patient po pain med 15 min early. She was crying in pain.

## 2015-04-26 NOTE — Progress Notes (Signed)
Per PT note patient walked 150 Feet today. PT is now recommending home health. RN Case Manager aware of above. Please reconsult if future social work needs arise. CSW signing off.   Jetta LoutBailey Morgan, LCSWA [phone]

## 2015-04-26 NOTE — Discharge Instructions (Signed)

## 2015-04-26 NOTE — Progress Notes (Signed)
Patient continues to c/o pain. PO pain meds working if given at regular intervals. Walking well with PT.  Possible discharge tomorrow. Continue to monitor.

## 2015-04-26 NOTE — Progress Notes (Signed)
Physical Therapy Treatment Patient Details Name: Kristin Orr MRN: [id number] DOB: [birth year] Today's Date: 04/26/2015    History of Present Illness This patient is a 39 year old female who came [facility] L TKR. She had her right knee replaced in 2014. 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     PT Comments    Patient continues to make good progress with mobility, and is now able to complete all in bed and out of bed mobility without physical assistance. She displays altered gait pattern during this session, though patient appeared to be off cognitively, possibly a result of pain medications? She was noted to drag her RLE at times, it appears due to flexing at her trunk as this was corrected with cuing for more upright posture. Patient is unable to perform SLR still secondary to pain/weakness, so PT utilizes knee brace during session still, patient advised to continue using this at home until recommended otherwise by HHPT. Skilled acute PT services continue to be indicated to address her balance and mobility deficits.   Follow Up Recommendations  Home health PT;Supervision for mobility/OOB;Supervision/Assistance - 24 hour     Equipment Recommendations  Rolling walker with 5" wheels;3in1 (PT)    Recommendations for Other Services       Precautions / Restrictions Precautions Precautions: Knee Precaution Comments: Knee immobilizer on until 10 SLRs completed.  Required Braces or Orthoses: Knee Immobilizer - Left Knee Immobilizer - Left: On at all times Restrictions Weight Bearing Restrictions: Yes LLE Weight Bearing: Weight bearing as tolerated    Mobility  Bed Mobility Overal bed mobility: Independent             General bed mobility comments: Patient displays foot under ankle technique to complete bed mobility.   Transfers Overall transfer level: Modified independent Equipment used: Rolling walker (2 wheeled)   Sit to Stand:  Modified independent (Device/Increase time)         General transfer comment: Patient cued to provide more anterior trunk displacement for vertical force to be provided by her UEs through RW.   Ambulation/Gait Ambulation/Gait assistance: Supervision Ambulation Distance (Feet): 225 Feet Assistive device: Rolling walker (2 wheeled) Gait Pattern/deviations: Decreased step length - left;Decreased step length - right;Decreased stance time - left;Antalgic;Decreased dorsiflexion - right;Decreased weight shift to left   Gait velocity interpretation: <1.8 ft/sec, indicative of risk for recurrent falls General Gait Details: Patient accepting weight appropriately with LLE, RLE drages behind her on occassion as she flexes her trunk to assist with WBing. Patient cued to stay upright, which resulted in decreased instances of this.    Stairs            Wheelchair Mobility    Modified Rankin (Stroke Patients Only)       Balance Overall balance assessment: Needs assistance Sitting-balance support: Feet unsupported Sitting balance-Leahy Scale: Good       Standing balance-Leahy Scale: Fair Standing balance comment: Patient is able to maintain her balance in static standing without UE support. Dynamically she continues to need RW, but no loss of balance.                     Cognition                            Exercises Total Joint Exercises Ankle Circles/Pumps: AROM;Both;20 reps Short Arc Quad: AROM;Left;10 reps Heel Slides: AAROM;10 reps;AROM;Right Hip ABduction/ADduction:  AROM;Both;10 reps Straight Leg Raises: AROM;Both;15 reps Other Exercises Other Exercises: Sit to stand training x 3 attempts with cuing to bring trunk anteriorly prior to standing.     General Comments        Pertinent Vitals/Pain Pain Score:  (Patient does not complain of pain during the session, rating questioned by RN just after session. ) Faces Pain Scale: Hurts little more Pain  Location: Left knee  Pain Descriptors / Indicators: Aching;Crying Pain Intervention(s): Limited activity within patient's tolerance;Monitored during session;Ice applied Banker provided pain meds after session. )    Home Living                      Prior Function            PT Goals (current goals can now be found in the care plan section) Acute Rehab PT Goals Patient Stated Goal: To go home PT Goal Formulation: With patient Time For Goal Achievement: 05/09/15 Potential to Achieve Goals: Good Progress towards PT goals: Progressing toward goals    Frequency  BID    PT Plan Current plan remains appropriate    Co-evaluation             End of Session Equipment Utilized During Treatment: Gait belt Activity Tolerance: Patient limited by pain Patient left: in bed;with call bell/phone within reach;with nursing/sitter in room     Time: 1610-9604 PT Time Calculation (min) (ACUTE ONLY): 23 min  Charges:  $Gait Training: 8-22 mins $Therapeutic Exercise: 8-22 mins                    G Codes:      Kerin Ransom, PT, DPT    04/26/2015, 5:11 PM

## 2015-04-27 NOTE — Progress Notes (Signed)
Physical Therapy Treatment Patient Details Name: Kristin Orr MRN: [id number] DOB: Apr 03, 1976 Today's Date: 04/27/2015    History of Present Illness This patient is a 39 year old female who came to [facility] for a L TKR. She had her right knee replaced in 2014. 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     PT Comments    Patient has made excellent progress with therapy and is now modified independent with all mobility. She displays decreased L knee flexion secondary to pain, and pain limiting SLRs hence she is maintained in knee extension brace. Patient appears safe with all home mobility having met acute PT goals. HHPT continues to be indicated to assist with device free ambulation balance/stability.   Follow Up Recommendations  Home health PT;Supervision for mobility/OOB;Supervision/Assistance - 24 hour     Equipment Recommendations  Rolling walker with 5" wheels;3in1 (PT)    Recommendations for Other Services       Precautions / Restrictions Precautions Precautions: Knee Precaution Comments: Knee immobilizer on until 10 SLRs completed.  Required Braces or Orthoses: Knee Immobilizer - Left Knee Immobilizer - Left: On at all times Restrictions Weight Bearing Restrictions: Yes LLE Weight Bearing: Weight bearing as tolerated    Mobility  Bed Mobility Overal bed mobility: Independent             General bed mobility comments: Patient displays foot under ankle technique to complete bed mobility.   Transfers Overall transfer level: Modified independent Equipment used: Rolling walker (2 wheeled) Transfers: Sit to/from Stand Sit to Stand: Modified independent (Device/Increase time)            Ambulation/Gait Ambulation/Gait assistance: Modified independent (Device/Increase time) Ambulation Distance (Feet): 125 Feet Assistive device: Rolling walker (2 wheeled) Gait Pattern/deviations: WFL(Within Functional Limits)   Gait  velocity interpretation: <1.8 ft/sec, indicative of risk for recurrent falls General Gait Details: Patient provided with cuing for increased knee flexion to initiate swing phase.    Stairs Stairs: Yes Stairs assistance: Modified independent (Device/Increase time) Stair Management: Two rails Number of Stairs: 4 General stair comments: Step to pattern performed with cuing for sequencing.   Wheelchair Mobility    Modified Rankin (Stroke Patients Only)       Balance Overall balance assessment: Modified Independent   Sitting balance-Leahy Scale: Good       Standing balance-Leahy Scale: Good Standing balance comment: Patient displays good static balance, though unable to perform dynamic balance secondary to knee immobilizer without RW.                     Cognition Arousal/Alertness: Awake/alert Behavior During Therapy: WFL for tasks assessed/performed;Anxious Overall Cognitive Status: Within Functional Limits for tasks assessed                      Exercises Total Joint Exercises Ankle Circles/Pumps: AROM;Both;20 reps Quad Sets: AROM;Both;10 reps Short Arc Quad: AROM;Left;10 reps Heel Slides: AAROM;10 reps;AROM;Right Hip ABduction/ADduction: AROM;Both;10 reps Straight Leg Raises: AROM;Both;15 reps;AAROM Goniometric ROM: 0-65 degrees of left knee flexion with pain limiting additional ROM.     General Comments        Pertinent Vitals/Pain Pain Assessment: No/denies pain Faces Pain Scale: Hurts little more Pain Location: left knee  Pain Descriptors / Indicators: Aching;Crying Pain Intervention(s): Limited activity within patient's tolerance;Monitored during session;Ice applied;Utilized relaxation techniques    Home Living  Prior Function            PT Goals (current goals can now be found in the care plan section) Acute Rehab PT Goals Patient Stated Goal: To go home PT Goal Formulation: With patient Time For Goal  Achievement: 05/09/15 Potential to Achieve Goals: Good Progress towards PT goals: Progressing toward goals    Frequency  BID    PT Plan Current plan remains appropriate    Co-evaluation             End of Session Equipment Utilized During Treatment: Gait belt Activity Tolerance: Patient limited by pain Patient left: in bed;with call bell/phone within reach;with nursing/sitter in room     Time: 1683-8706 PT Time Calculation (min) (ACUTE ONLY): 24 min  Charges:  $Gait Training: 8-22 mins $Therapeutic Exercise: 8-22 mins                    G Codes:      Kerman Passey, PT, DPT    04/27/2015, 1:27 PM

## 2015-04-27 NOTE — Progress Notes (Signed)
  Subjective: 3 Days Post-Op Procedure(s) (LRB): TOTAL KNEE ARTHROPLASTY (Left) Patient reports pain as mild.   Patient seen in rounds with Dr. Rosita KeaMenz. Patient is well, and has had no acute complaints or problems Plan is to go Home after hospital stay. Negative for chest pain and shortness of breath Fever: no Gastrointestinal:negative for nausea and vomiting  Objective: Vital signs in last 24 hours: Temp:  [97.9 F (36.6 C)-99 F (37.2 C)] 97.9 F (36.6 C) (07/15 0442) Pulse Rate:  [73-95] 73 (07/15 0444) Resp:  [16-18] 18 (07/15 0442) BP: (83-125)/(48-73) 94/57 mmHg (07/15 0444) SpO2:  [97 %-100 %] 97 % (07/15 0443)  Intake/Output from previous day:  Intake/Output Summary (Last 24 hours) at 04/27/15 0611 Last data filed at 04/26/15 1700  Gross per 24 hour  Intake    240 ml  Output      0 ml  Net    240 ml    Intake/Output this shift:    Labs:  Recent Labs  04/24/15 1046 04/25/15 0716 04/26/15 0720  HGB 12.5 12.2 11.9*    Recent Labs  04/25/15 0716 04/26/15 0720  WBC 21.6* 11.2*  RBC 3.96 3.88  HCT 38.0 37.2  PLT 407 333    Recent Labs  04/24/15 1046 04/25/15 0716  NA  --  139  K  --  3.5  CL  --  101  CO2  --  30  BUN  --  9  CREATININE 0.72 0.65  GLUCOSE  --  109*  CALCIUM  --  8.9   No results for input(s): LABPT, INR in the last 72 hours.   EXAM General - Patient is Alert and Oriented Extremity - Neurovascular intact Dorsiflexion/Plantar flexion intact Dressing/Incision - clean, dry, no drainage Motor Function - intact, moving foot and toes well on exam. Ambulating 225 feet with PT  Past Medical History  Diagnosis Date  . Hypothyroidism   . Depression   . Anxiety   . Hepatitis     Hepatitis C  . Restless leg syndrome   . Scoliosis   . Avascular necrosis of bone     Assessment/Plan: 3 Days Post-Op Procedure(s) (LRB): TOTAL KNEE ARTHROPLASTY (Left) Active Problems:   Avascular necrosis of medial femoral condyle  Estimated  body mass index is 48.85 kg/(m^2) as calculated from the following:   Height as of this encounter: 5\' 7"  (1.702 m).   Weight as of this encounter: 141.522 kg (312 lb). Discharge home with home health  DVT Prophylaxis - Lovenox, Foot Pumps and TED hose Weight-Bearing as tolerated to Left leg  Dedra Skeensodd Rosaly Labarbera, PA-C Orthopaedic Surgery 04/27/2015, 6:11 AM

## 2015-04-27 NOTE — Care Management (Signed)
DME delivered by [facility]. Lovenox approved and covered. Feliberto GottronJason Hinton with [facility] notified of patient discharge to home today. No further RNCM needs. Case closed.

## 2015-05-14 ENCOUNTER — Telehealth: Payer: Self-pay | Admitting: Gastroenterology

## 2015-05-14 NOTE — Telephone Encounter (Signed)
Patient said she was ready for labs for Hep C. She also mentioned that Lorenza Burton was going to put her on thyroid medication. I told her I would mention that to you but she will probably have to get a PCP

## 2015-05-15 NOTE — Telephone Encounter (Signed)
Pt scheduled for a follow up appt with Dr. Servando Snare on 06-14-15. Pt completed her Hep C treatment a month ago and was not aware she needed a follow up. Was a pt of Kandice and Dr. Servando Snare will take over care.

## 2015-06-13 ENCOUNTER — Other Ambulatory Visit: Payer: Self-pay

## 2015-06-13 DIAGNOSIS — M25569 Pain in unspecified knee: Secondary | ICD-10-CM

## 2015-06-13 DIAGNOSIS — R1013 Epigastric pain: Secondary | ICD-10-CM | POA: Insufficient documentation

## 2015-06-13 DIAGNOSIS — Z7251 High risk heterosexual behavior: Secondary | ICD-10-CM | POA: Insufficient documentation

## 2015-06-13 DIAGNOSIS — M419 Scoliosis, unspecified: Secondary | ICD-10-CM | POA: Insufficient documentation

## 2015-06-13 DIAGNOSIS — F329 Major depressive disorder, single episode, unspecified: Secondary | ICD-10-CM | POA: Insufficient documentation

## 2015-06-13 DIAGNOSIS — R110 Nausea: Secondary | ICD-10-CM | POA: Insufficient documentation

## 2015-06-13 DIAGNOSIS — M4120 Other idiopathic scoliosis, site unspecified: Secondary | ICD-10-CM | POA: Insufficient documentation

## 2015-06-13 DIAGNOSIS — F4310 Post-traumatic stress disorder, unspecified: Secondary | ICD-10-CM | POA: Insufficient documentation

## 2015-06-13 DIAGNOSIS — K838 Other specified diseases of biliary tract: Secondary | ICD-10-CM | POA: Insufficient documentation

## 2015-06-13 DIAGNOSIS — F419 Anxiety disorder, unspecified: Secondary | ICD-10-CM | POA: Insufficient documentation

## 2015-06-13 DIAGNOSIS — B1920 Unspecified viral hepatitis C without hepatic coma: Secondary | ICD-10-CM | POA: Insufficient documentation

## 2015-06-13 DIAGNOSIS — M5126 Other intervertebral disc displacement, lumbar region: Secondary | ICD-10-CM | POA: Insufficient documentation

## 2015-06-13 DIAGNOSIS — B182 Chronic viral hepatitis C: Secondary | ICD-10-CM

## 2015-06-13 DIAGNOSIS — F32A Depression, unspecified: Secondary | ICD-10-CM | POA: Insufficient documentation

## 2015-06-13 DIAGNOSIS — G8929 Other chronic pain: Secondary | ICD-10-CM

## 2015-06-14 ENCOUNTER — Ambulatory Visit: Payer: Self-pay | Admitting: Gastroenterology

## 2015-06-20 ENCOUNTER — Ambulatory Visit: Payer: Self-pay | Admitting: Gastroenterology

## 2015-07-18 ENCOUNTER — Ambulatory Visit: Payer: Self-pay | Admitting: Gastroenterology

## 2015-07-19 ENCOUNTER — Ambulatory Visit: Payer: Self-pay | Admitting: Gastroenterology

## 2015-07-26 ENCOUNTER — Ambulatory Visit: Payer: Self-pay | Admitting: Gastroenterology

## 2015-09-05 ENCOUNTER — Other Ambulatory Visit: Payer: Self-pay

## 2015-09-05 ENCOUNTER — Encounter: Payer: Self-pay | Admitting: Gastroenterology

## 2015-09-05 ENCOUNTER — Ambulatory Visit (INDEPENDENT_AMBULATORY_CARE_PROVIDER_SITE_OTHER): Payer: Medicaid Other | Admitting: Gastroenterology

## 2015-09-05 DIAGNOSIS — B182 Chronic viral hepatitis C: Secondary | ICD-10-CM | POA: Diagnosis not present

## 2015-09-05 NOTE — Progress Notes (Signed)
Primary Care Physician: Oswaldo Conroy, MD  Primary Gastroenterologist:  Dr. Midge Minium  Chief Complaint  Patient presents with  . Hepatitis C    Treatment completed    HPI: Kristin Orr is a 39 y.o. female here for follow-up of hepatitis C. The patient has missed previous appointments for follow-up after finishing the treatment. The patient states she does not feel any different before or after the treatment. The patient did feel fatigued during the treatment for her hepatitis C.  Current Outpatient Prescriptions  Medication Sig Dispense Refill  . gabapentin (NEURONTIN) 300 MG capsule Take 600 mg by mouth 3 (three) times daily.    Marland Kitchen ibuprofen (ADVIL,MOTRIN) 800 MG tablet Take 800 mg by mouth every 8 (eight) hours as needed.    . methadone (DOLOPHINE) 10 MG/ML solution Take 60 mg by mouth once.     . celecoxib (CELEBREX) 200 MG capsule Take 1 capsule (200 mg total) by mouth 2 (two) times daily. (Patient not taking: Reported on 09/05/2015) 30 capsule 0  . clonazePAM (KLONOPIN) 1 MG tablet Take 1 mg by mouth 3 (three) times daily as needed for anxiety.    . enoxaparin (LOVENOX) 40 MG/0.4ML injection Inject 0.4 mLs (40 mg total) into the skin daily. (Patient not taking: Reported on 09/05/2015) 14 Syringe 0  . fluconazole (DIFLUCAN) 150 MG tablet Take by mouth.    . ondansetron (ZOFRAN-ODT) 4 MG disintegrating tablet Take by mouth.    . oxyCODONE (OXY IR/ROXICODONE) 5 MG immediate release tablet Take 1-2 tablets (5-10 mg total) by mouth every 3 (three) hours as needed for breakthrough pain. (Patient not taking: Reported on 09/05/2015) 60 tablet 0  . traMADol (ULTRAM) 50 MG tablet Take 50 mg by mouth every 6 (six) hours as needed.     No current facility-administered medications for this visit.    Allergies as of 09/05/2015 - Review Complete 09/05/2015  Allergen Reaction Noted  . Albuterol Other (See Comments) 06/13/2015  . Cymbalta [duloxetine hcl] Anaphylaxis 04/18/2015  .  Sulfa antibiotics Anaphylaxis 06/13/2015  . Levaquin [levofloxacin] Swelling 04/18/2015  . Tramadol Other (See Comments) 06/13/2015    ROS:  General: Negative for anorexia, weight loss, fever, chills, fatigue, weakness. ENT: Negative for hoarseness, difficulty swallowing , nasal congestion. CV: Negative for chest pain, angina, palpitations, dyspnea on exertion, peripheral edema.  Respiratory: Negative for dyspnea at rest, dyspnea on exertion, cough, sputum, wheezing.  GI: See history of present illness. GU:  Negative for dysuria, hematuria, urinary incontinence, urinary frequency, nocturnal urination.  Endo: Negative for unusual weight change.    Physical Examination:   BP 112/65 mmHg  Pulse 86  Temp(Src) 98.3 F (36.8 C) (Oral)  Ht  (1.702 m)  Wt 141 lb (63.957 kg)  BMI 22.08 kg/m2  LMP 10/13/2006 (Approximate)  General: Well-nourished, well-developed in no acute distress.  Eyes: No icterus. Conjunctivae pink. Mouth: Oropharyngeal mucosa moist and pink , no lesions erythema or exudate. Lungs: Clear to auscultation bilaterally. Non-labored. Heart: Regular rate and rhythm, no murmurs rubs or gallops.  Abdomen: Bowel sounds are normal, nontender, nondistended, no hepatosplenomegaly or masses, no abdominal bruits or hernia , no rebound or guarding.   Extremities: No lower extremity edema. No clubbing or deformities. Neuro: Alert and oriented x 3.  Grossly intact. Skin: Warm and dry, no jaundice.   Psych: Alert and cooperative, normal mood and affect.  Labs:    Imaging Studies: No results found.  Assessment and Plan:   Kristin Orr is  a 39 y.o. y/o female who comes in today after completing treatment for her hepatitis C. The patient will have her labs checked to see if she is free of the virus and of her liver enzymes have returned to normal. The patient has been explained the plan and agrees with it.   Note: This dictation was prepared with Dragon dictation along  with smaller phrase technology. Any transcriptional errors that result from this process are unintentional.

## 2015-09-10 ENCOUNTER — Encounter: Payer: Self-pay | Admitting: Emergency Medicine

## 2015-09-10 ENCOUNTER — Emergency Department
Admission: EM | Admit: 2015-09-10 | Discharge: 2015-09-10 | Disposition: A | Discharge status: Home / Self Care | Payer: Medicaid Other | Attending: Emergency Medicine | Admitting: Emergency Medicine

## 2015-09-10 DIAGNOSIS — Y9289 Other specified places as the place of occurrence of the external cause: Secondary | ICD-10-CM | POA: Diagnosis not present

## 2015-09-10 DIAGNOSIS — G8929 Other chronic pain: Secondary | ICD-10-CM | POA: Diagnosis not present

## 2015-09-10 DIAGNOSIS — M549 Dorsalgia, unspecified: Secondary | ICD-10-CM | POA: Diagnosis not present

## 2015-09-10 DIAGNOSIS — R45851 Suicidal ideations: Secondary | ICD-10-CM | POA: Insufficient documentation

## 2015-09-10 DIAGNOSIS — F1310 Sedative, hypnotic or anxiolytic abuse, uncomplicated: Secondary | ICD-10-CM | POA: Diagnosis not present

## 2015-09-10 DIAGNOSIS — F329 Major depressive disorder, single episode, unspecified: Secondary | ICD-10-CM | POA: Diagnosis not present

## 2015-09-10 DIAGNOSIS — Y998 Other external cause status: Secondary | ICD-10-CM | POA: Insufficient documentation

## 2015-09-10 DIAGNOSIS — S61512A Laceration without foreign body of left wrist, initial encounter: Secondary | ICD-10-CM | POA: Diagnosis not present

## 2015-09-10 DIAGNOSIS — F17210 Nicotine dependence, cigarettes, uncomplicated: Secondary | ICD-10-CM | POA: Insufficient documentation

## 2015-09-10 DIAGNOSIS — Z3202 Encounter for pregnancy test, result negative: Secondary | ICD-10-CM | POA: Diagnosis not present

## 2015-09-10 DIAGNOSIS — F419 Anxiety disorder, unspecified: Secondary | ICD-10-CM | POA: Diagnosis not present

## 2015-09-10 DIAGNOSIS — F1510 Other stimulant abuse, uncomplicated: Secondary | ICD-10-CM | POA: Diagnosis not present

## 2015-09-10 DIAGNOSIS — R451 Restlessness and agitation: Secondary | ICD-10-CM | POA: Diagnosis present

## 2015-09-10 DIAGNOSIS — X781XXA Intentional self-harm by knife, initial encounter: Secondary | ICD-10-CM | POA: Insufficient documentation

## 2015-09-10 DIAGNOSIS — Y9389 Activity, other specified: Secondary | ICD-10-CM | POA: Diagnosis not present

## 2015-09-10 DIAGNOSIS — F1910 Other psychoactive substance abuse, uncomplicated: Secondary | ICD-10-CM

## 2015-09-10 LAB — CBC
HCT: 42.5 % (ref 35.0–47.0)
Hemoglobin: 13.9 g/dL (ref 12.0–16.0)
MCH: 30.1 pg (ref 26.0–34.0)
MCHC: 32.8 g/dL (ref 32.0–36.0)
MCV: 91.7 fL (ref 80.0–100.0)
PLATELETS: 334 10*3/uL (ref 150–440)
RBC: 4.63 MIL/uL (ref 3.80–5.20)
RDW: 13.3 % (ref 11.5–14.5)
WBC: 13.2 10*3/uL — AB (ref 3.6–11.0)

## 2015-09-10 LAB — URINE DRUG SCREEN, QUALITATIVE (ARMC ONLY)
AMPHETAMINES, UR SCREEN: POSITIVE — AB
BENZODIAZEPINE, UR SCRN: POSITIVE — AB
Barbiturates, Ur Screen: NOT DETECTED
CANNABINOID 50 NG, UR ~~LOC~~: NOT DETECTED
Cocaine Metabolite,Ur ~~LOC~~: NOT DETECTED
MDMA (Ecstasy)Ur Screen: NOT DETECTED
METHADONE SCREEN, URINE: POSITIVE — AB
Opiate, Ur Screen: NOT DETECTED
Phencyclidine (PCP) Ur S: NOT DETECTED
Tricyclic, Ur Screen: NOT DETECTED

## 2015-09-10 LAB — COMPREHENSIVE METABOLIC PANEL
ALBUMIN: 4.2 g/dL (ref 3.5–5.0)
ALT: 15 U/L (ref 14–54)
ANION GAP: 7 (ref 5–15)
AST: 22 U/L (ref 15–41)
Alkaline Phosphatase: 62 U/L (ref 38–126)
BILIRUBIN TOTAL: 1 mg/dL (ref 0.3–1.2)
BUN: 15 mg/dL (ref 6–20)
CO2: 29 mmol/L (ref 22–32)
Calcium: 9.7 mg/dL (ref 8.9–10.3)
Chloride: 103 mmol/L (ref 101–111)
Creatinine, Ser: 0.93 mg/dL (ref 0.44–1.00)
GFR calc Af Amer: >60 mL/min (ref >60)
Glucose, Bld: 109 mg/dL — ABNORMAL HIGH (ref 65–99)
POTASSIUM: 3.7 mmol/L (ref 3.5–5.1)
Sodium: 139 mmol/L (ref 135–145)
TOTAL PROTEIN: 7.9 g/dL (ref 6.5–8.1)

## 2015-09-10 LAB — POCT PREGNANCY, URINE: PREG TEST UR: NEGATIVE

## 2015-09-10 LAB — SALICYLATE LEVEL: Salicylate Lvl: <4 mg/dL (ref 2.8–30.0)

## 2015-09-10 LAB — ETHANOL

## 2015-09-10 LAB — ACETAMINOPHEN LEVEL

## 2015-09-10 MED ORDER — FLUOXETINE HCL 20 MG PO CAPS: 20.0000 mg | ORAL_CAPSULE | Freq: Every day | ORAL | Status: DC

## 2015-09-10 MED ORDER — DIAZEPAM 5 MG PO TABS
10.0000 mg | ORAL_TABLET | Freq: Once | ORAL | Status: AC
  Administered 2015-09-10: 10 mg via ORAL
  Filled 2015-09-10: qty 2

## 2015-09-10 MED ORDER — OLANZAPINE 5 MG PO TBDP: 2.5000 mg | ORAL_TABLET | Freq: Every day | ORAL | Status: DC

## 2015-09-10 NOTE — ED Notes (Signed)
States she needs to see someone  Has wanted to hurt herself yesterday pt is slightly agiatated in traige

## 2015-09-10 NOTE — Discharge Instructions (Signed)
Polysubstance Abuse When people abuse more than one drug or type of drug it is called polysubstance or polydrug abuse. For example, many smokers also drink alcohol. This is one form of polydrug abuse. Polydrug abuse also refers to the use of a drug to counteract an unpleasant effect produced by another drug. It may also be used to help with withdrawal from another drug. People who take stimulants may become agitated. Sometimes this agitation is countered with a tranquilizer. This helps protect against the unpleasant side effects. Polydrug abuse also refers to the use of different drugs at the same time.  Anytime drug use is interfering with normal living activities, it has become abuse. This includes problems with family and friends. Psychological dependence has developed when your mind tells you that the drug is needed. This is usually followed by physical dependence which has developed when continuing increases of drug are required to get the same feeling or "high". This is known as addiction or chemical dependency. A person's risk is much higher if there is a history of chemical dependency in the family. SIGNS OF CHEMICAL DEPENDENCY  You have been told by friends or family that drugs have become a problem.  You fight when using drugs.  You are having blackouts (not remembering what you do while using).  You feel sick from using drugs but continue using.  You lie about use or amounts of drugs (chemicals) used.  You need chemicals to get you going.  You are suffering in work performance or in school because of drug use.  You get sick from use of drugs but continue to use anyway.  You need drugs to relate to people or feel comfortable in social situations.  You use drugs to forget problems. "Yes" answered to any of the above signs of chemical dependency indicates there are problems. The longer the use of drugs continues, the greater the problems will become. If there is a family history of  drug or alcohol use, it is best not to experiment with these drugs. Continual use leads to tolerance. After tolerance develops more of the drug is needed to get the same feeling. This is followed by addiction. With addiction, drugs become the most important part of life. It becomes more important to take drugs than participate in the other usual activities of life. This includes relating to friends and family. Addiction is followed by dependency. Dependency is a condition where drugs are now needed not just to get high, but to feel normal. Addiction cannot be cured but it can be stopped. This often requires outside help and the care of professionals. Treatment centers are listed in the yellow pages under: Cocaine, Narcotics, and Alcoholics Anonymous. Most hospitals and clinics can refer you to a specialized care center. Talk to your caregiver if you need help.   This information is not intended to replace advice given to you by your health care provider. Make sure you discuss any questions you have with your health care provider.   Document Released: 05/21/2005 Document Revised: 12/22/2011 Document Reviewed: 10/04/2014 Elsevier Interactive Patient Education 2016 [employer]  Suicidal Feelings: How to Help Yourself Suicide is the taking of one's own life. If you feel as though life is getting too tough to handle and are thinking about suicide, get help right away. To get help:  Call your local emergency services (911 in the [place]  Call a suicide hotline to speak with a trained counselor who understands how you are feeling. The following is  a list of suicide hotlines in the [place]. For a list of hotlines in [place], visit InkDistributor.it.  1-800-273-TALK 386 664 0821).  1-800-SUICIDE 906 267 9929).  367-862-2032. This is a hotline for Spanish speakers.  4-696-295-2WUX 412-368-7802). This is a hotline for TTY  users.  1-866-4-U-TREVOR 5071707742). This is a hotline for lesbian, gay, bisexual, transgender, or questioning youth.  Contact a crisis center or a local suicide prevention center. To find a crisis center or suicide prevention center:  Call your local hospital, clinic, community service organization, mental health center, social service provider, or health department. Ask for assistance in connecting to a crisis center.  Visit https://www.patel-king.com/ for a list of crisis centers in the [place], or visit www.suicideprevention.ca/thinking-about-suicide/find-a-crisis-centre for a list of centers in [place].  Visit the following websites:  National Suicide Prevention Lifeline: www.suicidepreventionlifeline.org  Hopeline: www.hopeline.com  [employer] for Suicide Prevention: https://www.ayers.com/  [employer] (for lesbian, gay, bisexual, transgender, or questioning youth): www.thetrevorproject.org HOW CAN I HELP MYSELF FEEL BETTER?  Promise yourself that you will not do anything drastic when you have suicidal feelings. Remember, there is hope. Many people have gotten through suicidal thoughts and feelings, and you will, too. You may have gotten through them before, and this proves that you can get through them again.  Let family, friends, teachers, or counselors know how you are feeling. Try not to isolate yourself from those who care about you. Remember, they will want to help you. Talk with someone every day, even if you do not feel sociable. Face-to-face conversation is best.  Call a mental health professional and see one regularly.  Visit your primary health care provider every year.  Eat a well-balanced diet, and space your meals so you eat regularly.  Get plenty of rest.  Avoid alcohol and drugs, and remove them from your home. They will only make you feel worse.  If you are thinking of taking a lot of medicine, give your  medicine to someone who can give it to you one day at a time. If you are on antidepressants and are concerned you will overdose, let your health care provider know so he or she can give you safer medicines. Ask your mental health professional about the possible side effects of any medicines you are taking.  Remove weapons, poisons, knives, and anything else that could harm you from your home.  Try to stick to routines. Follow a schedule every day. Put self-care on your schedule.  Make a list of realistic goals, and cross them off when you achieve them. Accomplishments give a sense of worth.  Wait until you are feeling better before doing the things you find difficult or unpleasant.  Exercise if you are able. You will feel better if you exercise for even a half hour each day.  Go out in the sun or into nature. This will help you recover from depression faster. If you have a favorite place to walk, go there.  Do the things that have always given you pleasure. Play your favorite music, read a good book, paint a picture, play your favorite instrument, or do anything else that takes your mind off your depression if it is safe to do.  Keep your living space well lit.  When you are feeling well, write yourself a letter about tips and support that you can read when you are not feeling well.  Remember that life's difficulties can be sorted out with help. Conditions can be treated. You can work on thoughts and strategies  that serve you well.   This information is not intended to replace advice given to you by your health care provider. Make sure you discuss any questions you have with your health care provider.   Document Released: 04/05/2003 Document Revised: 10/20/2014 Document Reviewed: 01/24/2014 Elsevier Interactive Patient Education [employer]

## 2015-09-10 NOTE — ED Notes (Signed)
Patient assigned to appropriate care area. Patient oriented to unit/care area: Informed that, for their safety, care areas are designed for safety and monitored by security cameras at all times; and visiting hours explained to patient. Patient verbalizes understanding, and verbal contract for safety obtained. 

## 2015-09-10 NOTE — ED Notes (Signed)
BEHAVIORAL HEALTH ROUNDING Patient sleeping: No. Patient alert and oriented: yes Behavior appropriate: Yes.  ; If no, describe:  Nutrition and fluids offered: Yes  Toileting and hygiene offered: Yes  Sitter present: no Law enforcement present: Yes BEHAVIORAL HEALTH ROUNDING Patient sleeping: No. Patient alert and oriented: yes Behavior appropriate: Yes.  ; If no, describe:  Nutrition and fluids offered: Yes  Toileting and hygiene offered: Yes  Sitter present: yes Law enforcement present: Yes

## 2015-09-10 NOTE — ED Notes (Signed)
ENVIRONMENTAL ASSESSMENT Potentially harmful objects out of patient reach: Yes.   Personal belongings secured: Yes.   Patient dressed in hospital provided attire only: Yes.   Plastic bags out of patient reach: yes Patient care equipment (cords, cables, call bells, lines, and drains) shortened, removed, or accounted for: Yes.   Equipment and supplies removed from bottom of stretcher: Yes.   Potentially toxic materials out of patient reach: Yes.   Sharps container removed or out of patient reach: Yes.

## 2015-09-10 NOTE — ED Provider Notes (Addendum)
[facility] Emergency Department Provider Note     Time seen: ----------------------------------------- 12:47 PM on 09/10/2015 -----------------------------------------    I have reviewed the triage vital signs and the nursing notes.   HISTORY  Chief Complaint Agitation    HPI Kristin Orr is a 39 y.o. female who presents to ER stating she needs to make a change. She has a history of drug abuse, used to use IV drugs. Currently she is on methadone and she has run out of her Xanax. Patient states she and her mom got into an argument, she wanted to hurt herself yesterday. Patient's cut her left wrist with a knife yesterday. Currently she staying with her boyfriend and her boyfriend's friends.   Past Medical History  Diagnosis Date  . Hypothyroidism   . Depression   . Anxiety   . Hepatitis     Hepatitis C  . Restless leg syndrome   . Scoliosis   . Avascular necrosis of bone [facility](HCC)     Patient Active Problem List   Diagnosis Date Noted  . Hepatitis C 06/13/2015  . Idiopathic scoliosis 06/13/2015  . Chronic knee pain 06/13/2015  . Anxiety 06/13/2015  . Displacement of lumbar intervertebral disc without myelopathy 06/13/2015  . Abdominal pain, epigastric 06/13/2015  . Clinical depression 06/13/2015  . 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 High risk sexual behavior 06/13/2015  . Feeling bilious 06/13/2015  . 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 Scoliosis 06/13/2015  . 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 Joint replacement Right     Total Knee Replacement  . Cesarean section    . Abdominal hysterectomy    . Dilation and curettage of uterus    . Total knee arthroplasty Left 04/24/2015    Procedure: TOTAL KNEE ARTHROPLASTY;  Surgeon: Kennedy BuckerMichael Menz, MD;  Location: [facility] ORS;  Service: Orthopedics;  Laterality: Left;    Allergies Albuterol; Cymbalta; Sulfa antibiotics;  Levaquin; and Tramadol  Social History Social History  Substance Use Topics  . Smoking status: Current Every Day Smoker -- 1.00 packs/day    Types: Cigarettes  . Smokeless tobacco: Never Used  . Alcohol Use: No    Review of Systems Constitutional: Negative for fever. Eyes: Negative for visual changes. ENT: Negative for sore throat. Cardiovascular: Negative for chest pain. Respiratory: Negative for shortness of breath. Gastrointestinal: Negative for abdominal pain, vomiting and diarrhea. Genitourinary: Negative for dysuria. Musculoskeletal: Positive for chronic back pain, left wrist pain Skin: Negative for rash. Neurological: Negative for headaches, focal weakness or numbness. Psychiatric: Positive for depression and anxiety, patient denies suicidal or homicidal ideations, denies hallucinations  10-point ROS otherwise negative.  ____________________________________________   PHYSICAL EXAM:  VITAL SIGNS: ED Triage Vitals  Enc Vitals Group     BP 09/10/15 1222 112/80 mmHg     Pulse Rate 09/10/15 1222 87     Resp 09/10/15 1222 20     Temp 09/10/15 1222 98 F (36.7 C)     Temp Source 09/10/15 1222 Oral     SpO2 09/10/15 1222 98 %     Weight 09/10/15 1222 140 lb (63.504 kg)     Height 09/10/15 1222 5\' 7"  (1.702 m)     Head Cir --      Peak Flow --      Pain Score 09/10/15 1223 0     Pain Loc --      Pain Edu? --  Excl. in GC? --     Constitutional: Alert and oriented. Well appearing and in no distress. Eyes: Conjunctivae are normal. PERRL. Normal extraocular movements. ENT   Head: Normocephalic and atraumatic.   Nose: No congestion/rhinnorhea.   Mouth/Throat: Mucous membranes are moist.   Neck: No stridor. Cardiovascular: Normal rate, regular rhythm. Normal and symmetric distal pulses are present in all extremities. No murmurs, rubs, or gallops. Respiratory: Normal respiratory effort without tachypnea nor retractions. Breath sounds are clear and  equal bilaterally. No wheezes/rales/rhonchi. Gastrointestinal: Soft and nontender. No distention. No abdominal bruits.  Musculoskeletal: Nontender with normal range of motion in all extremities. No joint effusions.  No lower extremity tenderness nor edema. Neurologic:  Normal speech and language. No gross focal neurologic deficits are appreciated. Speech is normal. No gait instability. Skin:  4.5 cm left wrist laceration that is superficial Psychiatric: Depressed mood and affect. Patient tears easily  ____________________________________________  ED COURSE:  Pertinent labs & imaging results that were available during my care of the patient were reviewed by me and considered in my medical decision making (see chart for details). Patient's distress, will need laceration repair and psychiatric consultation.  LACERATION REPAIR Performed by: Emily Filbert Authorized by: Daryel November E Consent: Verbal consent obtained. Risks and benefits: risks, benefits and alternatives were discussed Consent given by: patient Patient identity confirmed: provided demographic data Prepped and Draped in normal sterile fashion Wound explored  Laceration Location: Left wrist  Laceration Length: 4.5 cm  No Foreign Bodies seen or palpated  Amount of cleaning: standard  Skin closure: Dermabond   Patient tolerance: Patient tolerated the procedure well with no immediate complications.   ____________________________________________    LABS (pertinent positives/negatives)  Labs Reviewed  COMPREHENSIVE METABOLIC PANEL - Abnormal; Notable for the following:    Glucose, Bld 109 (*)    All other components within normal limits  ACETAMINOPHEN LEVEL - Abnormal; Notable for the following:    Acetaminophen (Tylenol), Serum <10 (*)    All other components within normal limits  CBC - Abnormal; Notable for the following:    WBC 13.2 (*)    All other components within normal limits  URINE DRUG  SCREEN, QUALITATIVE ([facility] ONLY) - Abnormal; Notable for the following:    Amphetamines, Ur Screen POSITIVE (*)    Benzodiazepine, Ur Scrn POSITIVE (*)    Methadone Scn, Ur POSITIVE (*)    All other components within normal limits  ETHANOL  SALICYLATE LEVEL  POCT PREGNANCY, URINE   ____________________________________________  FINAL ASSESSMENT AND PLAN  Depression, intentional self-harm, superficial laceration, polysubstance abuse  Plan: Patient with labs and imaging as dictated above. Patient is not an immediate threat to herself. This appears to be more of a cry for help. Will discuss with psychiatry.   Emily Filbert, MD  A she'll drink clear by psychiatry for outpatient follow-up. She will be started on Zyprexa and fluoxetine. Patient agrees with plan, stable for discharge. Emily Filbert, MD 09/10/15 1250  Emily Filbert, MD 09/10/15 [phone]

## 2015-09-10 NOTE — ED Notes (Signed)
Self inflicted wound to left wrist with a knife, dermabond applied, pt lives with herboyfriend, states wants something to help her, , last use of cocaine was last week, yesterday was arguing with her boyfriend, states is unable to get aaway from the drugs

## 2015-09-26 ENCOUNTER — Ambulatory Visit: Payer: Self-pay | Admitting: Gastroenterology

## 2015-10-04 LAB — HEPATIC FUNCTION PANEL
ALBUMIN: 4.1 g/dL (ref 3.5–5.5)
ALK PHOS: 68 IU/L (ref 39–117)
ALT: 12 IU/L (ref 0–32)
AST: 16 IU/L (ref 0–40)
Bilirubin Total: <0.2 mg/dL (ref 0.0–1.2)
Bilirubin, Direct: 0.07 mg/dL (ref 0.00–0.40)
TOTAL PROTEIN: 6.7 g/dL (ref 6.0–8.5)

## 2015-10-09 ENCOUNTER — Telehealth: Payer: Self-pay | Admitting: Gastroenterology

## 2015-10-09 NOTE — Telephone Encounter (Signed)
Kristin Orr, patient called to get her lab results. Please call and advise. Thank You.

## 2015-10-11 NOTE — Telephone Encounter (Signed)
Pt has been advised we are waiting on her viral load. Will call as soon as available.

## 2015-10-16 ENCOUNTER — Telehealth: Payer: Self-pay

## 2015-10-16 LAB — HCV RT-PCR, QUANT (NON-GRAPH)

## 2015-10-16 NOTE — Telephone Encounter (Signed)
-----   Message from Midge Miniumarren Wohl, MD sent at 10/09/2015  8:38 AM EST ----- Let the patient know that her blood work showed normal liver enzymes.

## 2015-10-16 NOTE — Telephone Encounter (Signed)
Pt notified of lab results

## 2015-10-16 NOTE — Telephone Encounter (Signed)
-----   Message from Midge Miniumarren Wohl, MD sent at 10/16/2015 12:56 PM EST ----- The patient know that the hepatitis C viral load was negative

## 2015-10-16 NOTE — Telephone Encounter (Signed)
Pt has been notified of lab results. Pt has been advised that she will need her 6 months follow up in July. This will be her 1 year post treatment appt.

## 2015-12-06 ENCOUNTER — Telehealth: Payer: Self-pay | Admitting: Gastroenterology

## 2015-12-06 NOTE — Telephone Encounter (Signed)
Spoke to Kristin Orr 2/22 regarding Medicaid number because [employer] denied claims per [facility] [employer]. Pt. Said she would call medicaid and call me back today. I called her and had to leave a message for her to call me with the information please. [facility] form is under media

## 2015-12-10 ENCOUNTER — Telehealth: Payer: Self-pay | Admitting: Gastroenterology

## 2015-12-10 NOTE — Telephone Encounter (Signed)
Patient called and said there was a mix up with her medicaid when her son turned 51 but she called them and they are going to reinstate her medicaid. I sent this information over to [facility].

## 2015-12-11 ENCOUNTER — Telehealth: Payer: Self-pay | Admitting: Gastroenterology

## 2015-12-11 NOTE — Telephone Encounter (Signed)
Spoke with pt regarding her message below. Pt was told last March of 2016 she was not immune to Hep A or B. Pt stated she has been with her current partner for 5 years. Do we need to order a lab or is this something we have to wait. Please let me know.

## 2015-12-11 NOTE — Telephone Encounter (Signed)
Patient called and said she just found out her ex partner has Hep B. She said she started her shots in January and has 1 more to go.

## 2015-12-11 NOTE — Telephone Encounter (Signed)
See messages below.  

## 2016-01-02 ENCOUNTER — Emergency Department
Admission: EM | Admit: 2016-01-02 | Discharge: 2016-01-02 | Disposition: A | Discharge status: Home / Self Care | Payer: Medicaid Other | Attending: Emergency Medicine | Admitting: Emergency Medicine

## 2016-01-02 DIAGNOSIS — Z79899 Other long term (current) drug therapy: Secondary | ICD-10-CM | POA: Insufficient documentation

## 2016-01-02 DIAGNOSIS — R05 Cough: Secondary | ICD-10-CM | POA: Diagnosis present

## 2016-01-02 DIAGNOSIS — F17210 Nicotine dependence, cigarettes, uncomplicated: Secondary | ICD-10-CM | POA: Diagnosis not present

## 2016-01-02 DIAGNOSIS — J111 Influenza due to unidentified influenza virus with other respiratory manifestations: Secondary | ICD-10-CM | POA: Diagnosis not present

## 2016-01-02 MED ORDER — GUAIFENESIN-CODEINE 100-10 MG/5ML PO SOLN: 5.0000 mL | ORAL | Status: DC | PRN

## 2016-01-02 MED ORDER — OSELTAMIVIR PHOSPHATE 75 MG PO CAPS: 75.0000 mg | ORAL_CAPSULE | Freq: Two times a day (BID) | ORAL | Status: DC

## 2016-01-02 NOTE — ED Notes (Signed)
Pt arrives to ER for flu like sx. Son dx with flu last week. Pt febrile last night, took ibuprofen which relieved fever. Pt alert and oriented X4, active, cooperative, pt in NAD. RR even and unlabored, color WNL.

## 2016-01-02 NOTE — Discharge Instructions (Signed)
Influenza, Adult Influenza (flu) is an infection in the mouth, nose, and throat (respiratory tract) caused by a virus. The flu can make you feel very ill. Influenza spreads easily from person to person (contagious).  HOME CARE   Only take medicines as told by your doctor.  Use a cool mist humidifier to make breathing easier.  Get plenty of rest until your fever goes away. This usually takes 3 to 4 days.  Drink enough fluids to keep your pee (urine) clear or pale yellow.  Cover your mouth and nose when you cough or sneeze.  Wash your hands well to avoid spreading the flu.  Stay home from work or school until your fever has been gone for at least 1 full day.  Get a flu shot every year. GET HELP RIGHT AWAY IF:   You have trouble breathing or feel short of breath.  Your skin or nails turn blue.  You have severe neck pain or stiffness.  You have a severe headache, facial pain, or earache.  Your fever gets worse or keeps coming back.  You feel sick to your stomach (nauseous), throw up (vomit), or have watery poop (diarrhea).  You have chest pain.  You have a deep cough that gets worse, or you cough up more thick spit (mucus). MAKE SURE YOU:   Understand these instructions.  Will watch your condition.  Will get help right away if you are not doing well or get worse.   This information is not intended to replace advice given to you by your health care provider. Make sure you discuss any questions you have with your health care provider.   Document Released: 07/08/2008 Document Revised: 10/20/2014 Document Reviewed: 12/29/2011 Elsevier Interactive Patient Education [employer]2016 Elsevier Inc.   Take Tamiflu beginning a day for 5 days. Robitussin-AC as needed for cough and congestion. The aware that this cough medication has a narcotic and can cause drowsiness. Increase fluids. Take Tylenol or ibuprofen as needed for muscle aches and fever.

## 2016-01-02 NOTE — ED Notes (Signed)
Pt presents with flu like sx. Son dx with flu last week.

## 2016-01-02 NOTE — ED Provider Notes (Signed)
[facility] Emergency Department Provider Note  ____________________________________________  Time seen: Approximately 12:11 PM  I have reviewed the triage vital signs and the nursing notes.   HISTORY  Chief Complaint Influenza   HPI Kristin Orr is a 40 y.o. female is here complaining of flulike symptoms that began last night. Patient states that she took ibuprofen with relief of her fever. She also had some over-the-counter cough medication which did very poorly during the night. Patient has a son that was diagnosed with influenza be at [facility] last week. Patient denies any nausea, vomiting, or diarrhea. She has had fever and chills. Patient is a smoker.   Past Medical History  Diagnosis Date  . Hypothyroidism   . Depression   . Anxiety   . Hepatitis     Hepatitis C  . Restless leg syndrome   . Scoliosis   . Avascular necrosis of bone [facility](HCC)     Patient Active Problem List   Diagnosis Date Noted  . Hepatitis C 06/13/2015  . Idiopathic scoliosis 06/13/2015  . Chronic knee pain 06/13/2015  . Anxiety 06/13/2015  . Displacement of lumbar intervertebral disc without myelopathy 06/13/2015  . Abdominal pain, epigastric 06/13/2015  . Clinical depression 06/13/2015  . 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 High risk sexual behavior 06/13/2015  . Feeling bilious 06/13/2015  . 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 Scoliosis 06/13/2015  . 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 Joint replacement Right     Total Knee Replacement  . Cesarean section    . Abdominal hysterectomy    . Dilation and curettage of uterus    . Total knee arthroplasty Left 04/24/2015    Procedure: TOTAL KNEE ARTHROPLASTY;  Surgeon: Kennedy BuckerMichael Menz, MD;  Location: [facility] ORS;  Service: Orthopedics;  Laterality: Left;    Current Outpatient Rx  Name  Route  Sig  Dispense  Refill  . clonazePAM  (KLONOPIN) 1 MG tablet   Oral   Take 1 mg by mouth 3 (three) times daily as needed for anxiety.         . fluconazole (DIFLUCAN) 150 MG tablet   Oral   Take by mouth.         Marland Kitchen. FLUoxetine (PROZAC) 20 MG capsule   Oral   Take 1 capsule (20 mg total) by mouth daily.   30 capsule   2   . gabapentin (NEURONTIN) 300 MG capsule   Oral   Take 600 mg by mouth 3 (three) times daily.         Marland Kitchen. guaiFENesin-codeine 100-10 MG/5ML syrup   Oral   Take 5 mLs by mouth every 4 (four) hours as needed.   120 mL   0   . ibuprofen (ADVIL,MOTRIN) 800 MG tablet   Oral   Take 800 mg by mouth every 8 (eight) hours as needed.         . methadone (DOLOPHINE) 10 MG/ML solution   Oral   Take 60 mg by mouth once.          Marland Kitchen. OLANZapine zydis (ZYPREXA) 5 MG disintegrating tablet   Oral   Take 0.5 tablets (2.5 mg total) by mouth at bedtime.   30 tablet   1   . ondansetron (ZOFRAN-ODT) 4 MG disintegrating tablet   Oral   Take by mouth.         . oseltamivir (TAMIFLU) 75 MG capsule   Oral  Take 1 capsule (75 mg total) by mouth 2 (two) times daily.   10 capsule   0   . traMADol (ULTRAM) 50 MG tablet   Oral   Take 50 mg by mouth every 6 (six) hours as needed.           Allergies Albuterol; Cymbalta; Sulfa antibiotics; Levaquin; and Tramadol  Family History  Problem Relation Age of Onset  . Heart attack Mother   . Hypertension Mother     Social History Social History  Substance Use Topics  . Smoking status: Current Every Day Smoker -- 1.00 packs/day    Types: Cigarettes  . Smokeless tobacco: Never Used  . Alcohol Use: No    Review of Systems Constitutional: Positive fever/chills Eyes: No visual changes. ENT: Positive sore throat. Cardiovascular: Denies chest pain. Respiratory: Denies shortness of breath. Positive cough. Gastrointestinal:  No nausea, no vomiting.  Musculoskeletal: Positive muscle aches. Skin: Negative for rash. Neurological: Positive for  headaches, no focal weakness or numbness.  10-point ROS otherwise negative.  ____________________________________________   PHYSICAL EXAM:  VITAL SIGNS: ED Triage Vitals  Enc Vitals Group     BP 01/02/16 1153 113/75 mmHg     Pulse Rate 01/02/16 1153 109     Resp 01/02/16 1153 22     Temp 01/02/16 1153 99.6 F (37.6 C)     Temp Source 01/02/16 1153 Oral     SpO2 01/02/16 1153 96 %     Weight 01/02/16 1153 140 lb (63.504 kg)     Height 01/02/16 1153  (1.702 m)     Head Cir --      Peak Flow --      Pain Score 01/02/16 1153 4     Pain Loc --      Pain Edu? --      Excl. in GC? --     Constitutional: Alert and oriented. Well appearing and in no acute distress. Eyes: Conjunctivae are normal. PERRL. EOMI. Head: Atraumatic. Nose:Mild congestion/rhinnorhea. Mouth/Throat: Mucous membranes are moist.  Oropharynx non-erythematous. Neck: No stridor.  Supple Hematological/Lymphatic/Immunilogical: No cervical lymphadenopathy. Cardiovascular: Normal rate, regular rhythm. Grossly normal heart sounds.  Good peripheral circulation. Respiratory: Normal respiratory effort.  No retractions. Lungs CTAB. Gastrointestinal: Soft and nontender. No distention. Musculoskeletal: Moves upper and lower extremities without any difficulty. Normal gait was noted. Neurologic:  Normal speech and language. No gross focal neurologic deficits are appreciated. No gait instability. Skin:  Skin is warm, dry and intact. No rash noted. Psychiatric: Mood and affect are normal. Speech and behavior are normal.  ____________________________________________   LABS (all labs ordered are listed, but only abnormal results are displayed)  Labs Reviewed - No data to display  PROCEDURES  Procedure(s) performed: None  Critical Care performed: No  ____________________________________________   INITIAL IMPRESSION / ASSESSMENT AND PLAN / ED COURSE  Pertinent labs & imaging results that were available during  my care of the patient were reviewed by me and considered in my medical decision making (see chart for details).  Patient was treated with Tamiflu as she has a son at home with confirmed fluid at [facility] pediatrics. Patient is encouraged to continue drinking fluids and also take Tylenol or ibuprofen as needed for body aches and fever. ____________________________________________   FINAL CLINICAL IMPRESSION(S) / ED DIAGNOSES  Final diagnoses:  Influenza      Tommi Rumps, PA-C 01/02/16 1639  Myrna Blazer, MD 01/07/16 2039

## 2016-01-22 ENCOUNTER — Other Ambulatory Visit: Payer: Self-pay | Admitting: Ophthalmology

## 2016-01-22 DIAGNOSIS — M8700 Idiopathic aseptic necrosis of unspecified bone: Secondary | ICD-10-CM

## 2016-01-23 ENCOUNTER — Ambulatory Visit: Payer: Self-pay | Admitting: Gastroenterology

## 2016-02-04 ENCOUNTER — Emergency Department: Payer: Medicaid Other

## 2016-02-04 ENCOUNTER — Emergency Department
Admission: EM | Admit: 2016-02-04 | Discharge: 2016-02-04 | Disposition: A | Discharge status: Home / Self Care | Payer: Medicaid Other | Attending: Emergency Medicine | Admitting: Emergency Medicine

## 2016-02-04 DIAGNOSIS — E039 Hypothyroidism, unspecified: Secondary | ICD-10-CM | POA: Diagnosis not present

## 2016-02-04 DIAGNOSIS — Z881 Allergy status to other antibiotic agents status: Secondary | ICD-10-CM | POA: Insufficient documentation

## 2016-02-04 DIAGNOSIS — Y92410 Unspecified street and highway as the place of occurrence of the external cause: Secondary | ICD-10-CM | POA: Insufficient documentation

## 2016-02-04 DIAGNOSIS — S161XXA Strain of muscle, fascia and tendon at neck level, initial encounter: Secondary | ICD-10-CM | POA: Diagnosis not present

## 2016-02-04 DIAGNOSIS — Z96659 Presence of unspecified artificial knee joint: Secondary | ICD-10-CM | POA: Diagnosis not present

## 2016-02-04 DIAGNOSIS — F418 Other specified anxiety disorders: Secondary | ICD-10-CM | POA: Diagnosis not present

## 2016-02-04 DIAGNOSIS — S4991XA Unspecified injury of right shoulder and upper arm, initial encounter: Secondary | ICD-10-CM | POA: Diagnosis present

## 2016-02-04 DIAGNOSIS — S40011A Contusion of right shoulder, initial encounter: Secondary | ICD-10-CM | POA: Diagnosis not present

## 2016-02-04 DIAGNOSIS — Z888 Allergy status to other drugs, medicaments and biological substances status: Secondary | ICD-10-CM | POA: Insufficient documentation

## 2016-02-04 DIAGNOSIS — Z885 Allergy status to narcotic agent status: Secondary | ICD-10-CM | POA: Insufficient documentation

## 2016-02-04 DIAGNOSIS — Y9389 Activity, other specified: Secondary | ICD-10-CM | POA: Diagnosis not present

## 2016-02-04 DIAGNOSIS — Z90710 Acquired absence of both cervix and uterus: Secondary | ICD-10-CM | POA: Insufficient documentation

## 2016-02-04 DIAGNOSIS — F17210 Nicotine dependence, cigarettes, uncomplicated: Secondary | ICD-10-CM | POA: Diagnosis not present

## 2016-02-04 DIAGNOSIS — Y999 Unspecified external cause status: Secondary | ICD-10-CM | POA: Diagnosis not present

## 2016-02-04 DIAGNOSIS — Z79899 Other long term (current) drug therapy: Secondary | ICD-10-CM | POA: Insufficient documentation

## 2016-02-04 MED ORDER — HYDROCODONE-ACETAMINOPHEN 5-325 MG PO TABS
1.0000 | ORAL_TABLET | ORAL | Status: AC
  Administered 2016-02-04: 1 via ORAL
  Filled 2016-02-04: qty 1

## 2016-02-04 MED ORDER — TIZANIDINE HCL 4 MG PO TABS: 4.0000 mg | ORAL_TABLET | Freq: Three times a day (TID) | ORAL | Status: DC

## 2016-02-04 MED ORDER — CYCLOBENZAPRINE HCL 10 MG PO TABS
10.0000 mg | ORAL_TABLET | Freq: Once | ORAL | Status: DC
  Filled 2016-02-04: qty 1

## 2016-02-04 MED ORDER — HYDROCODONE-ACETAMINOPHEN 5-325 MG PO TABS: 1.0000 | ORAL_TABLET | Freq: Four times a day (QID) | ORAL | Status: DC | PRN

## 2016-02-04 NOTE — Discharge Instructions (Signed)
Cervical Sprain °A cervical sprain is when the tissues (ligaments) that hold the neck bones in place stretch or tear. °HOME CARE  °· Put ice on the injured area. °· Put ice in a plastic bag. °· Place a towel between your skin and the bag. °· Leave the ice on for 15-20 minutes, 3-4 times a day. °· You may have been given a collar to wear. This collar keeps your neck from moving while you heal. °· Do not take the collar off unless told by your doctor. °· If you have long hair, keep it outside of the collar. °· Ask your doctor before changing the position of your collar. You may need to change its position over time to make it more comfortable. °· If you are allowed to take off the collar for cleaning or bathing, follow your doctor's instructions on how to do it safely. °· Keep your collar clean by wiping it with mild soap and water. Dry it completely. If the collar has removable pads, remove them every 1-2 days to hand wash them with soap and water. Allow them to air dry. They should be dry before you wear them in the collar. °· Do not drive while wearing the collar. °· Only take medicine as told by your doctor. °· Keep all doctor visits as told. °· Keep all physical therapy visits as told. °· Adjust your work station so that you have good posture while you work. °· Avoid positions and activities that make your problems worse. °· Warm up and stretch before being active. °GET HELP IF: °· Your pain is not controlled with medicine. °· You cannot take less pain medicine over time as planned. °· Your activity level does not improve as expected. °GET HELP RIGHT AWAY IF:  °· You are bleeding. °· Your stomach is upset. °· You have an allergic reaction to your medicine. °· You develop new problems that you cannot explain. °· You lose feeling (become numb) or you cannot move any part of your body (paralysis). °· You have tingling or weakness in any part of your body. °· Your symptoms get worse. Symptoms include: °· Pain,  soreness, stiffness, puffiness (swelling), or a burning feeling in your neck. °· Pain when your neck is touched. °· Shoulder or upper back pain. °· Limited ability to move your neck. °· Headache. °· Dizziness. °· Your hands or arms feel week, lose feeling, or tingle. °· Muscle spasms. °· Difficulty swallowing or chewing. °MAKE SURE YOU:  °· Understand these instructions. °· Will watch your condition. °· Will get help right away if you are not doing well or get worse. °  °This information is not intended to replace advice given to you by your health care provider. Make sure you discuss any questions you have with your health care provider. °  °Document Released: 03/17/2008 Document Revised: 06/01/2013 Document Reviewed: 04/06/2013 °Elsevier Interactive Patient Education ©2016 Elsevier Inc. ° °Contusion °A contusion is a deep bruise. Contusions are the result of a blunt injury to tissues and muscle fibers under the skin. The injury causes bleeding under the skin. The skin overlying the contusion may turn blue, purple, or yellow. Minor injuries will give you a painless contusion, but more severe contusions may stay painful and swollen for a few weeks.  °CAUSES  °This condition is usually caused by a blow, trauma, or direct force to an area of the body. °SYMPTOMS  °Symptoms of this condition include: °· Swelling of the injured area. °· Pain and   tenderness in the injured area. °· Discoloration. The area may have redness and then turn blue, purple, or yellow. °DIAGNOSIS  °This condition is diagnosed based on a physical exam and medical history. An X-ray, CT scan, or MRI may be needed to determine if there are any associated injuries, such as broken bones (fractures). °TREATMENT  °Specific treatment for this condition depends on what area of the body was injured. In general, the best treatment for a contusion is resting, icing, applying pressure to (compression), and elevating the injured area. This is often called the  RICE strategy. Over-the-counter anti-inflammatory medicines may also be recommended for pain control.  °HOME CARE INSTRUCTIONS  °· Rest the injured area. °· If directed, apply ice to the injured area: °¨ Put ice in a plastic bag. °¨ Place a towel between your skin and the bag. °¨ Leave the ice on for 20 minutes, 2-3 times per day. °· If directed, apply light compression to the injured area using an elastic bandage. Make sure the bandage is not wrapped too tightly. Remove and reapply the bandage as directed by your health care provider. °· If possible, raise (elevate) the injured area above the level of your heart while you are sitting or lying down. °· Take over-the-counter and prescription medicines only as told by your health care provider. °SEEK MEDICAL CARE IF: °· Your symptoms do not improve after several days of treatment. °· Your symptoms get worse. °· You have difficulty moving the injured area. °SEEK IMMEDIATE MEDICAL CARE IF:  °· You have severe pain. °· You have numbness in a hand or foot. °· Your hand or foot turns pale or cold. °  °This information is not intended to replace advice given to you by your health care provider. Make sure you discuss any questions you have with your health care provider. °  °Document Released: 07/09/2005 Document Revised: 06/20/2015 Document Reviewed: 02/14/2015 °Elsevier Interactive Patient Education ©2016 Elsevier Inc. ° °
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
## 2016-02-05 ENCOUNTER — Ambulatory Visit: Payer: Disability Insurance | Attending: Ophthalmology

## 2016-02-05 DIAGNOSIS — J988 Other specified respiratory disorders: Secondary | ICD-10-CM | POA: Diagnosis not present

## 2016-02-05 DIAGNOSIS — M8788 Other osteonecrosis, other site: Secondary | ICD-10-CM | POA: Diagnosis present

## 2016-02-05 DIAGNOSIS — M8700 Idiopathic aseptic necrosis of unspecified bone: Secondary | ICD-10-CM

## 2016-02-05 MED ORDER — ALBUTEROL SULFATE (2.5 MG/3ML) 0.083% IN NEBU
2.5000 mg | INHALATION_SOLUTION | Freq: Once | RESPIRATORY_TRACT | Status: AC
  Administered 2016-02-05: 2.5 mg via RESPIRATORY_TRACT
  Filled 2016-02-05: qty 3

## 2016-02-27 ENCOUNTER — Ambulatory Visit (INDEPENDENT_AMBULATORY_CARE_PROVIDER_SITE_OTHER): Payer: Medicaid Other | Admitting: Gastroenterology

## 2016-02-27 ENCOUNTER — Encounter: Payer: Self-pay | Admitting: Gastroenterology

## 2016-02-27 VITALS — BP 111/62 | HR 79 | Temp 98.5°F | Ht 67.0 in | Wt 134.0 lb

## 2016-02-27 DIAGNOSIS — B182 Chronic viral hepatitis C: Secondary | ICD-10-CM | POA: Diagnosis not present

## 2016-02-27 DIAGNOSIS — Z205 Contact with and (suspected) exposure to viral hepatitis: Secondary | ICD-10-CM | POA: Diagnosis not present
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
## 2016-02-28 ENCOUNTER — Telehealth: Payer: Self-pay

## 2016-02-28 NOTE — Telephone Encounter (Signed)
-----   Message from Midge Miniumarren Wohl, MD sent at 02/28/2016 12:36 PM EDT ----- Let the patient know that her hepatitis B was negative for infection but positive for immunity meaning that she does not have the virus and may be immune to it. She should still get the third vaccination.

## 2016-02-28 NOTE — Telephone Encounter (Signed)
Pt notified of lab results. Pt stated she is due to get the last Hep B injection this month. Advised pt we highly recommend this.

## 2016-03-11 ENCOUNTER — Telehealth: Payer: Self-pay

## 2016-03-11 LAB — HCV RT-PCR, QUANT (NON-GRAPH)

## 2016-03-11 LAB — HEPATIC FUNCTION PANEL
ALBUMIN: 4.2 g/dL (ref 3.5–5.5)
ALT: 17 IU/L (ref 0–32)
AST: 20 IU/L (ref 0–40)
Alkaline Phosphatase: 73 IU/L (ref 39–117)
BILIRUBIN, DIRECT: 0.05 mg/dL (ref 0.00–0.40)
TOTAL PROTEIN: 7.1 g/dL (ref 6.0–8.5)

## 2016-03-11 LAB — HEPATITIS B SURFACE ANTIGEN: Hepatitis B Surface Ag: NEGATIVE

## 2016-03-11 LAB — HEPATITIS B SURFACE ANTIBODY,QUALITATIVE: Hep B Surface Ab, Qual: REACTIVE

## 2016-03-11 NOTE — Telephone Encounter (Signed)
LVM for pt to return my call.

## 2016-03-11 NOTE — Telephone Encounter (Signed)
-----   Message from Midge Miniumarren Wohl, MD sent at 03/11/2016  1:06 PM EDT ----- Let the patient know her hepatitis C was negative and the hepatitis B was negative.

## 2016-03-14 NOTE — Telephone Encounter (Signed)
Pt notified of lab results

## 2016-07-18 ENCOUNTER — Emergency Department
Admission: EM | Admit: 2016-07-18 | Discharge: 2016-07-18 | Disposition: A | Discharge status: Home / Self Care | Payer: Medicaid Other | Attending: Emergency Medicine | Admitting: Emergency Medicine

## 2016-07-18 DIAGNOSIS — J40 Bronchitis, not specified as acute or chronic: Secondary | ICD-10-CM | POA: Diagnosis not present

## 2016-07-18 DIAGNOSIS — Z79899 Other long term (current) drug therapy: Secondary | ICD-10-CM | POA: Insufficient documentation

## 2016-07-18 DIAGNOSIS — Z96651 Presence of right artificial knee joint: Secondary | ICD-10-CM | POA: Diagnosis not present

## 2016-07-18 DIAGNOSIS — E039 Hypothyroidism, unspecified: Secondary | ICD-10-CM | POA: Diagnosis not present

## 2016-07-18 DIAGNOSIS — R05 Cough: Secondary | ICD-10-CM | POA: Diagnosis present

## 2016-07-18 DIAGNOSIS — F17210 Nicotine dependence, cigarettes, uncomplicated: Secondary | ICD-10-CM | POA: Diagnosis not present

## 2016-07-18 LAB — URINALYSIS COMPLETE WITH MICROSCOPIC (ARMC ONLY)
Bilirubin Urine: NEGATIVE
GLUCOSE, UA: NEGATIVE mg/dL
Hgb urine dipstick: NEGATIVE
KETONES UR: NEGATIVE mg/dL
NITRITE: NEGATIVE
PROTEIN: NEGATIVE mg/dL
SPECIFIC GRAVITY, URINE: 1.008 (ref 1.005–1.030)
pH: 6 (ref 5.0–8.0)

## 2016-07-18 LAB — COMPREHENSIVE METABOLIC PANEL
ALBUMIN: 3.9 g/dL (ref 3.5–5.0)
ALK PHOS: 52 U/L (ref 38–126)
ALT: 12 U/L — AB (ref 14–54)
AST: 17 U/L (ref 15–41)
Anion gap: 5 (ref 5–15)
BILIRUBIN TOTAL: 0.6 mg/dL (ref 0.3–1.2)
BUN: 9 mg/dL (ref 6–20)
CALCIUM: 9.2 mg/dL (ref 8.9–10.3)
CO2: 27 mmol/L (ref 22–32)
CREATININE: 0.81 mg/dL (ref 0.44–1.00)
Chloride: 106 mmol/L (ref 101–111)
GFR calc Af Amer: >60 mL/min (ref >60)
GFR calc non Af Amer: >60 mL/min (ref >60)
GLUCOSE: 86 mg/dL (ref 65–99)
Potassium: 3.9 mmol/L (ref 3.5–5.1)
SODIUM: 138 mmol/L (ref 135–145)
TOTAL PROTEIN: 7 g/dL (ref 6.5–8.1)

## 2016-07-18 LAB — CBC
HCT: 38.9 % (ref 35.0–47.0)
Hemoglobin: 13.3 g/dL (ref 12.0–16.0)
MCH: 31.7 pg (ref 26.0–34.0)
MCHC: 34.2 g/dL (ref 32.0–36.0)
MCV: 92.9 fL (ref 80.0–100.0)
Platelets: 263 10*3/uL (ref 150–440)
RBC: 4.19 MIL/uL (ref 3.80–5.20)
RDW: 11.8 % (ref 11.5–14.5)
WBC: 10.1 10*3/uL (ref 3.6–11.0)

## 2016-07-18 MED ORDER — NITROFURANTOIN MONOHYD MACRO 100 MG PO CAPS: 100.0000 mg | ORAL_CAPSULE | Freq: Two times a day (BID) | ORAL | 0 refills | Status: AC

## 2016-07-18 MED ORDER — ALBUTEROL SULFATE HFA 108 (90 BASE) MCG/ACT IN AERS: 2.0000 | INHALATION_SPRAY | Freq: Four times a day (QID) | RESPIRATORY_TRACT | 2 refills | Status: AC | PRN

## 2016-07-18 MED ORDER — AZITHROMYCIN 250 MG PO TABS: ORAL_TABLET | ORAL | 0 refills | Status: AC

## 2016-07-18 NOTE — ED Triage Notes (Signed)
[facility] serum potassium 6.4 . Pt states she has been tired, sweating, and MD there wanted her to come to the ED for further  evaluation

## 2016-07-18 NOTE — ED Provider Notes (Addendum)
[facility] Emergency Department Provider Note  ____________________________________________   I have reviewed the triage vital signs and the nursing notes.   HISTORY  Chief Complaint Abnormal Lab    HPI Kristin Orr is a 40 y.o. female resents with multiple complex patient is a smoker has had URI symptoms with a productive cough for the last few days. She is also being currently treated for urinary tract infection, with Macrobid, but she lost the last 2 pills of the prescription is taken it for 3 days she milligrams symptoms which she would like to see if we can replace the Macrobid pill. Finally, a CBC and a BMP was checked as apparently as an outpatient and they found her to have a dangerously high potassium and they sent her in for evaluation of that.. Of note, patient is not allergic to albuterol, images sometimes makes her "jittery".      Past Medical History:  Diagnosis Date  . Anxiety   . Avascular necrosis of bone (HCC)   . Depression   . Hepatitis    Hepatitis C  . Hypothyroidism   . Restless leg syndrome   . Scoliosis     Patient Active Problem List   Diagnosis Date Noted  . Hepatitis C 06/13/2015  . Idiopathic scoliosis 06/13/2015  . Chronic knee pain 06/13/2015  . Anxiety 06/13/2015  . Displacement of lumbar intervertebral disc without myelopathy 06/13/2015  . Abdominal pain, epigastric 06/13/2015  . Clinical depression 06/13/2015  . 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 High risk sexual behavior 06/13/2015  . Feeling bilious 06/13/2015  . 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 Scoliosis 06/13/2015  . 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 ABDOMINAL HYSTERECTOMY    . CESAREAN SECTION    . DILATION AND CURETTAGE OF UTERUS    . JOINT REPLACEMENT Right    Total Knee Replacement  . TOTAL KNEE ARTHROPLASTY Left 04/24/2015   Procedure: TOTAL KNEE ARTHROPLASTY;   Surgeon: Kennedy Bucker, MD;  Location: [facility] ORS;  Service: Orthopedics;  Laterality: Left;    Prior to Admission medications   Medication Sig Start Date End Date Taking? Authorizing Provider  clonazePAM (KLONOPIN) 1 MG tablet Take 1 mg by mouth 3 (three) times daily as needed for anxiety. Reported on 02/27/2016    Historical Provider, MD  fluconazole (DIFLUCAN) 150 MG tablet Take by mouth. Reported on 02/27/2016    Historical Provider, MD  FLUoxetine (PROZAC) 20 MG capsule Take 1 capsule (20 mg total) by mouth daily. Patient not taking: Reported on 02/27/2016 09/10/15 09/09/16  Emily Filbert, MD  gabapentin (NEURONTIN) 300 MG capsule Take 600 mg by mouth 3 (three) times daily. Reported on 02/27/2016    Historical Provider, MD  guaiFENesin-codeine 100-10 MG/5ML syrup Take 5 mLs by mouth every 4 (four) hours as needed. Patient not taking: Reported on 02/27/2016 01/02/16   Tommi Rumps, PA-C  HYDROcodone-acetaminophen (NORCO) 5-325 MG tablet Take 1 tablet by mouth every 6 (six) hours as needed for moderate pain. 02/04/16   Evon Slack, PA-C  ibuprofen (ADVIL,MOTRIN) 800 MG tablet Take 800 mg by mouth every 8 (eight) hours as needed.    Historical Provider, MD  methadone (DOLOPHINE) 10 MG/ML solution Take 60 mg by mouth once.     Historical Provider, MD  OLANZapine zydis (ZYPREXA) 5 MG disintegrating tablet Take 0.5 tablets (2.5 mg total) by mouth at bedtime. 09/10/15   Emily Filbert, MD  ondansetron (ZOFRAN-ODT) 4 MG disintegrating tablet Take by mouth. Reported on 02/27/2016    Historical Provider, MD  oseltamivir (TAMIFLU) 75 MG capsule Take 1 capsule (75 mg total) by mouth 2 (two) times daily. Patient not taking: Reported on 02/27/2016 01/02/16   Tommi Rumpshonda L Summers, PA-C  tiZANidine (ZANAFLEX) 4 MG tablet Take 1 tablet (4 mg total) by mouth 3 (three) times daily. 02/04/16 02/03/17  Evon Slackhomas C Gaines, PA-C  traMADol (ULTRAM) 50 MG tablet Take 50 mg by mouth every 6 (six) hours as needed.     Historical Provider, MD    Allergies Albuterol; Cymbalta [duloxetine hcl]; Sulfa antibiotics; Levaquin [levofloxacin]; and Tramadol  Family History  Problem Relation Age of Onset  . Heart attack Mother   . Hypertension Mother     Social History Social History  Substance Use Topics  . Smoking status: Current Every Day Smoker    Packs/day: 1.00    Types: Cigarettes  . Smokeless tobacco: Never Used  . Alcohol use No    Review of Systems Constitutional: No fever/chills Eyes: No visual changes. ENT: No sore throat. No stiff neck no neck pain Cardiovascular: Denies chest pain. Respiratory: Denies shortness of breath.Positive cough Gastrointestinal:   no vomiting.  No diarrhea.  No constipation. Genitourinary: Negative for dysuria. Musculoskeletal: Negative lower extremity swelling Skin: Negative for rash. Neurological: Negative for severe headaches, focal weakness or numbness. 10-point ROS otherwise negative.  ____________________________________________   PHYSICAL EXAM:  VITAL SIGNS: ED Triage Vitals [07/18/16 1148]  Enc Vitals Group     BP 106/72     Pulse Rate 80     Resp 16     Temp 98.3 F (36.8 C)     Temp Source Oral     SpO2 96 %     Weight 135 lb (61.2 kg)     Height 5\' 6"  (1.676 m)     Head Circumference      Peak Flow      Pain Score      Pain Loc      Pain Edu?      Excl. in GC?     Constitutional: Alert and oriented. Well appearing and in no acute distress. Eyes: Conjunctivae are normal. PERRL. EOMI. Head: Atraumatic. Nose: No congestion/rhinnorhea. Mouth/Throat: Mucous membranes are moist.  Oropharynx non-erythematous. Neck: No stridor.   Nontender with no meningismus Cardiovascular: Normal rate, regular rhythm. Grossly normal heart sounds.  Good peripheral circulation. Respiratory: Normal respiratory effort.  No retractions. Occasional diffuse rhonchi clears with cough Abdominal: Soft and nontender. No distention. No guarding no  rebound Back:  There is no focal tenderness or step off.  there is no midline tenderness there are no lesions noted. there is no CVA tenderness Musculoskeletal: No lower extremity tenderness, no upper extremity tenderness. No joint effusions, no DVT signs strong distal pulses no edema Neurologic:  Normal speech and language. No gross focal neurologic deficits are appreciated.  Skin:  Skin is warm, dry and intact. No rash noted. Psychiatric: Mood and affect are normal. Speech and behavior are normal.  ____________________________________________   LABS (all labs ordered are listed, but only abnormal results are displayed)  Labs Reviewed  COMPREHENSIVE METABOLIC PANEL - Abnormal; Notable for the following:       Result Value   ALT 12 (*)    All other components within normal limits  URINALYSIS COMPLETEWITH MICROSCOPIC ([facility] ONLY) - Abnormal; Notable for the following:    Color, Urine YELLOW (*)    APPearance CLEAR (*)  Leukocytes, UA 1+ (*)    Bacteria, UA RARE (*)    Squamous Epithelial / LPF 0-5 (*)    All other components within normal limits  URINE CULTURE  CBC   ____________________________________________  EKG  I personally interpreted any EKGs ordered by me or triage  ____________________________________________  RADIOLOGY  I reviewed any imaging ordered by me or triage that were performed during my shift and, if possible, patient and/or family made aware of any abnormal findings. ____________________________________________   PROCEDURES  Procedure(s) performed: None  Procedures  Critical Care performed: None  ____________________________________________   INITIAL IMPRESSION / ASSESSMENT AND PLAN / ED COURSE  Pertinent labs & imaging results that were available during my care of the patient were reviewed by me and considered in my medical decision making (see chart for details).  Patient with multiple complaints. The first his UTI is no evidence of  significant pyelonephritis etc. Patient was on Macrobid and she "lost" the last 2 prescription pills, she would likely give her prescription for that. Her urine is noted here.. We will send her with Macrobid and I will culture. Second his cough patient is a pack-a-day smoker she has a productive cough this is not likely pneumonia but we will treat her with 8 typical coverage given her significant tobacco history and likely early COPD. Patient has had to have inhalers in the past and has a history of pneumonias in the past but lungs do not show any focality we are going to treat any way therefore do not see any utility in chest x-ray. Finally, patient was told that she had elevated potassium but she does not. That was likely hemolysis. Return precautions and follow-up given and understood.  Clinical Course   ____________________________________________   FINAL CLINICAL IMPRESSION(S) / ED DIAGNOSES  Final diagnoses:  None      This chart was dictated using voice recognition software.  Despite best efforts to proofread,  errors can occur which can change meaning.      Jeanmarie Plant, MD 07/18/16 1806    Jeanmarie Plant, MD 07/18/16 1806    Jeanmarie Plant, MD 07/18/16 [phone]

## 2016-07-19 LAB — URINE CULTURE: Culture: NO GROWTH

## 2016-09-02 ENCOUNTER — Telehealth: Payer: Self-pay | Admitting: Gastroenterology

## 2016-09-02 NOTE — Telephone Encounter (Signed)
Patient thinks she might be due for a colonoscopy but not sure since they found polyps. Please call

## 2016-09-02 NOTE — Telephone Encounter (Signed)
Advised pt's mother we will scheduled her a colonoscopy at her office visit on Monday, Nov 27th.

## 2016-09-09 ENCOUNTER — Telehealth: Payer: Self-pay | Admitting: Gastroenterology

## 2016-09-09 NOTE — Telephone Encounter (Signed)
colonoscopy

## 2016-09-19 ENCOUNTER — Other Ambulatory Visit: Payer: Self-pay

## 2016-09-19 MED ORDER — PEG 3350-KCL-NABCB-NACL-NASULF 236 G PO SOLR: ORAL | 0 refills | Status: DC

## 2016-09-19 NOTE — Telephone Encounter (Signed)
Please precert.  [facility] Z86.010.

## 2016-09-19 NOTE — Telephone Encounter (Signed)
Gastroenterology Pre-Procedure Review  Request Date:  Requesting Physician: Dr.   PATIENT REVIEW QUESTIONS: The patient responded to the following health history questions as indicated:    1. Are you having any GI issues? no 2. Do you have a personal history of Polyps? yes (repeat 5 years) 3. Do you have a family history of Colon Cancer or Polyps? no 4. Diabetes Mellitus? no 5. Joint replacements in the past 12 months?no 6. Major health problems in the past 3 months?no 7. Any artificial heart valves, MVP, or defibrillator?no    MEDICATIONS & ALLERGIES:    Patient reports the following regarding taking any anticoagulation/antiplatelet therapy:   Plavix, Coumadin, Eliquis, Xarelto, Lovenox, Pradaxa, Brilinta, or Effient? no Aspirin? no  Patient confirms/reports the following medications:  Current Outpatient Prescriptions  Medication Sig Dispense Refill  . albuterol (PROVENTIL HFA;VENTOLIN HFA) 108 (90 Base) MCG/ACT inhaler Inhale 2 puffs into the lungs every 6 (six) hours as needed for wheezing or shortness of breath. 1 Inhaler 2  . clonazePAM (KLONOPIN) 1 MG tablet Take 1 mg by mouth 3 (three) times daily as needed for anxiety. Reported on 02/27/2016    . fluconazole (DIFLUCAN) 150 MG tablet Take by mouth. Reported on 02/27/2016    . FLUoxetine (PROZAC) 20 MG capsule Take 1 capsule (20 mg total) by mouth daily. (Patient not taking: Reported on 02/27/2016) 30 capsule 2  . gabapentin (NEURONTIN) 300 MG capsule Take 600 mg by mouth 3 (three) times daily. Reported on 02/27/2016    . guaiFENesin-codeine 100-10 MG/5ML syrup Take 5 mLs by mouth every 4 (four) hours as needed. (Patient not taking: Reported on 02/27/2016) 120 mL 0  . HYDROcodone-acetaminophen (NORCO) 5-325 MG tablet Take 1 tablet by mouth every 6 (six) hours as needed for moderate pain. 15 tablet 0  . ibuprofen (ADVIL,MOTRIN) 800 MG tablet Take 800 mg by mouth every 8 (eight) hours as needed.    . methadone (DOLOPHINE) 10 MG/ML  solution Take 60 mg by mouth once.     Marland Kitchen. OLANZapine zydis (ZYPREXA) 5 MG disintegrating tablet Take 0.5 tablets (2.5 mg total) by mouth at bedtime. 30 tablet 1  . ondansetron (ZOFRAN-ODT) 4 MG disintegrating tablet Take by mouth. Reported on 02/27/2016    . oseltamivir (TAMIFLU) 75 MG capsule Take 1 capsule (75 mg total) by mouth 2 (two) times daily. (Patient not taking: Reported on 02/27/2016) 10 capsule 0  . tiZANidine (ZANAFLEX) 4 MG tablet Take 1 tablet (4 mg total) by mouth 3 (three) times daily. 20 tablet 0  . traMADol (ULTRAM) 50 MG tablet Take 50 mg by mouth every 6 (six) hours as needed.     No current facility-administered medications for this visit.     Patient confirms/reports the following allergies:  Allergies  Allergen Reactions  . Albuterol Other (See Comments)    Other Reaction: INCREASED HEART RATE & SHAKING  . Cymbalta [Duloxetine Hcl] Anaphylaxis  . Sulfa Antibiotics Anaphylaxis  . Levaquin [Levofloxacin] Swelling  . Tramadol Other (See Comments)    Other Reaction: HALLUCINATIONS  Patient states she is not allergic to this medication.    No orders of the defined types were placed in this encounter.   AUTHORIZATION INFORMATION Primary Insurance: 1D#: Group #:  Secondary Insurance: 1D#: Group #:  SCHEDULE INFORMATION: Date: 09/29/16 Time: Location: [facility]

## 2016-09-23 ENCOUNTER — Telehealth: Payer: Self-pay | Admitting: Gastroenterology

## 2016-09-23 NOTE — Telephone Encounter (Signed)
Called patient to get insurance information. Patient did not answer and has a full mailbox

## 2016-09-24 ENCOUNTER — Encounter: Payer: Self-pay | Admitting: Anesthesiology

## 2016-09-24 ENCOUNTER — Encounter: Payer: Self-pay | Admitting: *Deleted

## 2016-09-24 NOTE — Telephone Encounter (Signed)
Please call patient and go over instructions for colonoscopy and she wasn't sure if you called in the prep or if you mailed it to her.

## 2016-09-25 ENCOUNTER — Other Ambulatory Visit: Payer: Self-pay

## 2016-09-25 MED ORDER — PEG 3350-KCL-NABCB-NACL-NASULF 236 G PO SOLR: ORAL | 0 refills | Status: AC

## 2016-09-25 NOTE — Telephone Encounter (Signed)
Asked pt if she received her instructions in the mail and she stated no. Verified her mailing and address and changed. Pt will stop by and pick up instructions.

## 2016-09-26 NOTE — Discharge Instructions (Signed)

## 2016-09-29 ENCOUNTER — Ambulatory Visit: Admission: RE | Admit: 2016-09-29 | Payer: Disability Insurance | Source: Ambulatory Visit | Admitting: Gastroenterology

## 2016-09-29 HISTORY — DX: Headache, unspecified: R51.9

## 2016-09-29 HISTORY — DX: Presence of spectacles and contact lenses: Z97.3

## 2016-09-29 HISTORY — DX: Headache: R51

## 2016-09-29 HISTORY — DX: Presence of dental prosthetic device (complete) (partial): Z97.2

## 2016-09-29 HISTORY — DX: Family history of other specified conditions: Z84.89

## 2016-09-29 HISTORY — DX: Dorsopathy, unspecified: M53.9

## 2016-09-29 HISTORY — DX: Unspecified asthma, uncomplicated: J45.909

## 2016-09-29 SURGERY — COLONOSCOPY WITH PROPOFOL
Anesthesia: Choice

## 2016-10-10 NOTE — Telephone Encounter (Signed)
Patient has medicaid. Pre cert is not required
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
## 2017-05-07 ENCOUNTER — Telehealth: Payer: Self-pay | Admitting: Gastroenterology

## 2017-05-07 NOTE — Telephone Encounter (Signed)
Patient called and is ready to schedule colonoscopy. 

## 2017-05-11 ENCOUNTER — Other Ambulatory Visit: Payer: Self-pay

## 2017-05-11 ENCOUNTER — Telehealth: Payer: Self-pay

## 2017-05-11 DIAGNOSIS — Z86010 Personal history of colonic polyps: Secondary | ICD-10-CM

## 2017-05-11 NOTE — Telephone Encounter (Signed)
Gastroenterology Pre-Procedure Review  Request Date: 06/04/17 Requesting Physician: Dr. Servando SnareWohl  PATIENT REVIEW QUESTIONS: The patient responded to the following health history questions as indicated:    1. Are you having any GI issues? yes (Constipation) 2. Do you have a personal history of Polyps? yes (8 years ago) 3. Do you have a family history of Colon Cancer or Polyps? yes (Mom-polyps, anut-colon cancer, grandfather colon cancer) 4. Diabetes Mellitus? no 5. Joint replacements in the past 12 months?no 6. Major health problems in the past 3 months?no 7. Any artificial heart valves, MVP, or defibrillator?no    MEDICATIONS & ALLERGIES:    Patient reports the following regarding taking any anticoagulation/antiplatelet therapy:   Plavix, Coumadin, Eliquis, Xarelto, Lovenox, Pradaxa, Brilinta, or Effient? no Aspirin? no  Patient confirms/reports the following medications:  Current Outpatient Prescriptions  Medication Sig Dispense Refill  . albuterol (PROVENTIL HFA;VENTOLIN HFA) 108 (90 Base) MCG/ACT inhaler Inhale 2 puffs into the lungs every 6 (six) hours as needed for wheezing or shortness of breath. (Patient not taking: Reported on 09/24/2016) 1 Inhaler 2  . gabapentin (NEURONTIN) 300 MG capsule Take 600 mg by mouth 3 (three) times daily. Reported on 02/27/2016 (09/24/16 - as needed)    . ibuprofen (ADVIL,MOTRIN) 800 MG tablet Take 800 mg by mouth every 8 (eight) hours as needed.    . methadone (DOLOPHINE) 10 MG/ML solution Take 95 mg by mouth daily.     . polyethylene glycol (GOLYTELY) 236 g solution Drink one 8 oz glass every 20 mins until stools are clear. 4000 mL 0   No current facility-administered medications for this visit.     Patient confirms/reports the following allergies:  Allergies  Allergen Reactions  . Albuterol Other (See Comments)    Other Reaction: INCREASED HEART RATE & SHAKING  . Cymbalta [Duloxetine Hcl] Anaphylaxis  . Sulfa Antibiotics Anaphylaxis  .  Levaquin [Levofloxacin] Swelling  . Tramadol Other (See Comments)    Other Reaction: HALLUCINATIONS  Patient states she is not allergic to this medication. Has taken with out reaction    No orders of the defined types were placed in this encounter.   AUTHORIZATION INFORMATION Primary Insurance: 1D#: Group #:  Secondary Insurance: 1D#: Group #:  SCHEDULE INFORMATION: Date: 06/04/17 Time: Location:[facility]

## 2017-06-01 ENCOUNTER — Encounter: Payer: Self-pay | Admitting: *Deleted

## 2017-06-03 NOTE — Discharge Instructions (Signed)
General Anesthesia, Adult, Care After °These instructions provide you with information about caring for yourself after your procedure. Your health care provider may also give you more specific instructions. Your treatment has been planned according to current medical practices, but problems sometimes occur. Call your health care provider if you have any problems or questions after your procedure. °What can I expect after the procedure? °After the procedure, it is common to have: °· Vomiting. °· A sore throat. °· Mental slowness. ° °It is common to feel: °· Nauseous. °· Cold or shivery. °· Sleepy. °· Tired. °· Sore or achy, even in parts of your body where you did not have surgery. ° °Follow these instructions at home: °For at least 24 hours after the procedure: °· Do not: °? Participate in activities where you could fall or become injured. °? Drive. °? Use heavy machinery. °? Drink alcohol. °? Take sleeping pills or medicines that cause drowsiness. °? Make important decisions or sign legal documents. °? Take care of children on your own. °· Rest. °Eating and drinking °· If you vomit, drink water, juice, or soup when you can drink without vomiting. °· Drink enough fluid to keep your urine clear or pale yellow. °· Make sure you have little or no nausea before eating solid foods. °· Follow the diet recommended by your health care provider. °General instructions °· Have a responsible adult stay with you until you are awake and alert. °· Return to your normal activities as told by your health care provider. Ask your health care provider what activities are safe for you. °· Take over-the-counter and prescription medicines only as told by your health care provider. °· If you smoke, do not smoke without supervision. °· Keep all follow-up visits as told by your health care provider. This is important. °Contact a health care provider if: °· You continue to have nausea or vomiting at home, and medicines are not helpful. °· You  cannot drink fluids or start eating again. °· You cannot urinate after 8-12 hours. °· You develop a skin rash. °· You have fever. °· You have increasing redness at the site of your procedure. °Get help right away if: °· You have difficulty breathing. °· You have chest pain. °· You have unexpected bleeding. °· You feel that you are having a life-threatening or urgent problem. °This information is not intended to replace advice given to you by your health care provider. Make sure you discuss any questions you have with your health care provider. °Document Released: 01/05/2001 Document Revised: 03/03/2016 Document Reviewed: 09/13/2015 °Elsevier Interactive Patient Education © 2018 [employer]vier Inc. ° °

## 2017-06-04 ENCOUNTER — Encounter
Admission: RE | Disposition: A | Discharge status: Home / Self Care | Payer: Self-pay | Source: Ambulatory Visit | Attending: Gastroenterology

## 2017-06-04 ENCOUNTER — Ambulatory Visit: Payer: Medicaid Other | Admitting: Anesthesiology

## 2017-06-04 ENCOUNTER — Ambulatory Visit
Admission: RE | Admit: 2017-06-04 | Discharge: 2017-06-04 | Disposition: A | Discharge status: Home / Self Care | Payer: Medicaid Other | Source: Ambulatory Visit | Attending: Gastroenterology | Admitting: Gastroenterology

## 2017-06-04 DIAGNOSIS — Z1211 Encounter for screening for malignant neoplasm of colon: Secondary | ICD-10-CM | POA: Insufficient documentation

## 2017-06-04 DIAGNOSIS — K648 Other hemorrhoids: Secondary | ICD-10-CM | POA: Diagnosis not present

## 2017-06-04 DIAGNOSIS — E039 Hypothyroidism, unspecified: Secondary | ICD-10-CM | POA: Diagnosis not present

## 2017-06-04 DIAGNOSIS — Z860100 Personal history of colon polyps, unspecified: Secondary | ICD-10-CM

## 2017-06-04 DIAGNOSIS — M419 Scoliosis, unspecified: Secondary | ICD-10-CM | POA: Insufficient documentation

## 2017-06-04 DIAGNOSIS — Z96653 Presence of artificial knee joint, bilateral: Secondary | ICD-10-CM | POA: Diagnosis not present

## 2017-06-04 DIAGNOSIS — F17210 Nicotine dependence, cigarettes, uncomplicated: Secondary | ICD-10-CM | POA: Insufficient documentation

## 2017-06-04 DIAGNOSIS — G2581 Restless legs syndrome: Secondary | ICD-10-CM | POA: Diagnosis not present

## 2017-06-04 HISTORY — PX: COLONOSCOPY WITH PROPOFOL: SHX5780

## 2017-06-04 SURGERY — COLONOSCOPY WITH PROPOFOL
Anesthesia: General | Wound class: Clean Contaminated

## 2017-06-04 MED ORDER — STERILE WATER FOR IRRIGATION IR SOLN
Status: DC | PRN
  Administered 2017-06-04: 100 mL

## 2017-06-04 MED ORDER — LACTATED RINGERS IV SOLN
10.0000 mL/h | INTRAVENOUS | Status: DC
  Administered 2017-06-04: 10 mL/h via INTRAVENOUS

## 2017-06-04 MED ORDER — LIDOCAINE HCL (CARDIAC) 20 MG/ML IV SOLN
INTRAVENOUS | Status: DC | PRN
  Administered 2017-06-04: 50 mg via INTRAVENOUS

## 2017-06-04 MED ORDER — PROPOFOL 10 MG/ML IV BOLUS
INTRAVENOUS | Status: DC | PRN
  Administered 2017-06-04: 150 mg via INTRAVENOUS
  Administered 2017-06-04 (×2): 50 mg via INTRAVENOUS

## 2017-06-04 SURGICAL SUPPLY — 23 items

## 2017-06-04 NOTE — Op Note (Signed)
[facility] Gastroenterology Patient Name: Kristin Orr Procedure Date: 06/04/2017 9:32 AM MRN: [id number] Account #: [id number] Date of Birth: 12/28/1975 Admit Type: Outpatient Age: 41 Room: [facility] OR ROOM 01 Gender: Female Note Status: Finalized Procedure:            Colonoscopy Indications:          High risk colon cancer surveillance: Personal history                        of colonic polyps Providers:            Midge Minium MD, MD Referring MD:         Oswaldo Done. Hessie Diener MD, MD (Referring MD) Medicines:            Propofol per Anesthesia Complications:        No immediate complications. Procedure:            Pre-Anesthesia Assessment:                       - Prior to the procedure, a History and Physical was                        performed, and patient medications and allergies were                        reviewed. The patient's tolerance of previous                        anesthesia was also reviewed. The risks and benefits of                        the procedure and the sedation options and risks were                        discussed with the patient. All questions were                        answered, and informed consent was obtained. Prior                        Anticoagulants: The patient has taken no previous                        anticoagulant or antiplatelet agents. ASA Grade                        Assessment: II - A patient with mild systemic disease.                        After reviewing the risks and benefits, the patient was                        deemed in satisfactory condition to undergo the                        procedure.                       After obtaining informed consent, the colonoscope was  passed under direct vision. Throughout the procedure,                        the patient's blood pressure, pulse, and oxygen                        saturations were monitored continuously. The Olympus CF   H180AL Colonoscope (S#: [id number]) was introduced through                        the anus and advanced to the the cecum, identified by                        appendiceal orifice and ileocecal valve. The                        colonoscopy was performed without difficulty. The                        patient tolerated the procedure well. The quality of                        the bowel preparation was excellent. Findings:      The perianal and digital rectal examinations were normal.      Non-bleeding internal hemorrhoids were found during retroflexion. The       hemorrhoids were Grade II (internal hemorrhoids that prolapse but reduce       spontaneously).      The exam was otherwise without abnormality. Impression:           - Non-bleeding internal hemorrhoids.                       - The examination was otherwise normal.                       - No specimens collected. Recommendation:       - Discharge patient to home.                       - Resume previous diet.                       - Continue present medications.                       - Repeat colonoscopy in 5 years for surveillance. Procedure Code(s):    --- Professional ---                       [id number], Colonoscopy, flexible; diagnostic, including                        collection of specimen(s) by brushing or washing, when                        performed (separate procedure) Diagnosis Code(s):    --- Professional ---                       Z86.010, Personal history of colonic polyps CPT copyright 2016 American Medical Association. All rights reserved. The codes documented in this report are preliminary and upon coder review may  be revised to meet current compliance requirements. Midge Minium MD, MD 06/04/2017 9:55:27 AM This report has been signed electronically. Number of Addenda: 0 Note Initiated On: 06/04/2017 9:32 AM Scope Withdrawal Time: 0 hours 6 minutes 39 seconds  Total Procedure Duration: 0 hours 12 minutes 57 seconds        [facility]

## 2017-06-04 NOTE — Transfer of Care (Signed)
Immediate Anesthesia Transfer of Care Note  Patient: Kristin Orr  Procedure(s) Performed: Procedure(s): COLONOSCOPY WITH PROPOFOL (N/A)  Patient Location: PACU  Anesthesia Type: General  Level of Consciousness: awake, alert  and patient cooperative  Airway and Oxygen Therapy: Patient Spontanous Breathing and Patient connected to supplemental oxygen  Post-op Assessment: Post-op Vital signs reviewed, Patient's Cardiovascular Status Stable, Respiratory Function Stable, Patent Airway and No signs of Nausea or vomiting  Post-op Vital Signs: Reviewed and stable  Complications: No apparent anesthesia complications

## 2017-06-04 NOTE — Anesthesia Preprocedure Evaluation (Signed)
Anesthesia Evaluation  Patient identified by MRN, date of birth, ID band Patient awake    Reviewed: Allergy & Precautions, NPO status , Patient's Chart, lab work & pertinent test results  History of Anesthesia Complications Negative for: history of anesthetic complications  Airway Mallampati: I  TM Distance: >3 FB Neck ROM: Full    Dental  (+) Upper Dentures, Lower Dentures   Pulmonary asthma , Current Smoker (1 ppd),    Pulmonary exam normal breath sounds clear to auscultation       Cardiovascular Exercise Tolerance: Good negative cardio ROS Normal cardiovascular exam Rhythm:Regular Rate:Normal     Neuro/Psych PSYCHIATRIC DISORDERS Anxiety Depression On methadone; took dose this AM    GI/Hepatic negative GI ROS, (+) Hepatitis -, C  Endo/Other  Hypothyroidism   Renal/GU      Musculoskeletal Scoliosis    Abdominal   Peds  Hematology negative hematology ROS (+)   Anesthesia Other Findings   Reproductive/Obstetrics                             Anesthesia Physical Anesthesia Plan  ASA: II  Anesthesia Plan: General   Post-op Pain Management:    Induction: Intravenous  PONV Risk Score and Plan: 1 and Ondansetron and Propofol infusion  Airway Management Planned: Natural Airway  Additional Equipment:   Intra-op Plan:   Post-operative Plan:   Informed Consent: I have reviewed the patients History and Physical, chart, labs and discussed the procedure including the risks, benefits and alternatives for the proposed anesthesia with the patient or authorized representative who has indicated his/her understanding and acceptance.     Plan Discussed with: CRNA  Anesthesia Plan Comments:         Anesthesia Quick Evaluation

## 2017-06-04 NOTE — Anesthesia Procedure Notes (Signed)
Date/Time: 06/04/2017 9:37 AM Performed by: Maree Krabbe Pre-anesthesia Checklist: Patient identified, Emergency Drugs available, Suction available, Timeout performed and Patient being monitored Patient Re-evaluated:Patient Re-evaluated prior to induction Oxygen Delivery Method: Nasal cannula Placement Confirmation: positive ETCO2

## 2017-06-04 NOTE — Anesthesia Postprocedure Evaluation (Signed)
Anesthesia Post Note  Patient: Kristin Orr  Procedure(s) Performed: Procedure(s) (LRB): COLONOSCOPY WITH PROPOFOL (N/A)  Patient location during evaluation: PACU Anesthesia Type: General Level of consciousness: awake and alert, oriented and patient cooperative Pain management: pain level controlled Vital Signs Assessment: post-procedure vital signs reviewed and stable Respiratory status: spontaneous breathing, nonlabored ventilation and respiratory function stable Cardiovascular status: blood pressure returned to baseline and stable Postop Assessment: adequate PO intake Anesthetic complications: no    Reed Breech

## 2017-06-04 NOTE — H&P (Signed)
Midge Minium, MD [facility] [address], [address] [place], [place] [address] Phone:[phone] Fax : [phone]  Primary Care Physician:  Titus Mould, NP Primary Gastroenterologist:  Dr. Servando Snare  Pre-Procedure History & Physical: HPI:  Kristin Orr is a 41 y.o. female is here for an colonoscopy.   Past Medical History:  Diagnosis Date  . Anxiety   . Asthma    does not use inhalers  . Avascular necrosis of bone (HCC)    knees (before replacements)  . Depression   . Family history of adverse reaction to anesthesia    mother - slow to awaken  . Headache    migraine hx - none iin 3 yrs  . Hepatitis    Hepatitis C  . Hypothyroidism   . Multilevel degenerative disc disease    no limitations in movement  . Restless leg syndrome   . Scoliosis   . Wears contact lenses   . Wears dentures    full upper, partial lower    Past Surgical History:  Procedure Laterality Date  . ABDOMINAL HYSTERECTOMY    . CESAREAN SECTION    . DILATION AND CURETTAGE OF UTERUS    . JOINT REPLACEMENT Right    Total Knee Replacement  . 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 Authorizing Provider  gabapentin (NEURONTIN) 300 MG capsule Take 600 mg by mouth 3 (three) times daily. Reported on 02/27/2016 (09/24/16 - as needed)   Yes [provider]  ibuprofen (ADVIL,MOTRIN) 800 MG tablet Take 800 mg by mouth every 8 (eight) hours as needed.   Yes [provider]  methadone (DOLOPHINE) 10 MG/ML solution Take 95 mg by mouth daily.    Yes [provider]  polyethylene glycol (GOLYTELY) 236 g solution Drink one 8 oz glass every 20 mins until stools are clear. 09/25/16  Yes Midge Minium, MD  albuterol (PROVENTIL HFA;VENTOLIN HFA) 108 (90 Base) MCG/ACT inhaler Inhale 2  puffs into the lungs every 6 (six) hours as needed for wheezing or shortness of breath. Patient not taking: Reported on 09/24/2016 07/18/16   Jeanmarie Plant, MD    Allergies as of 05/11/2017 - Review Complete 07/18/2016  Allergen Reaction Noted  . Albuterol Other (See Comments) 06/13/2015  . Cymbalta [duloxetine hcl] Anaphylaxis 04/18/2015  . Sulfa antibiotics Anaphylaxis 06/13/2015  . Levaquin [levofloxacin] Swelling 04/18/2015  . Tramadol Other (See Comments) 06/13/2015    Family History  Problem Relation Age of Onset  . Heart attack Mother   . Hypertension Mother     Social History   Social History  . Marital status: Single    Spouse name: N/A  . Number of children: N/A  . Years of education: N/A   Occupational History  . Not on file.   Social History Main Topics  . Smoking status: Current Every Day Smoker    Packs/day: 1.00    Years: 22.00    Types: Cigarettes  . Smokeless tobacco: Never Used     Comment: since age 65  . Alcohol use No  . Drug use: No     Comment: Former iv drug user. Clean since 02/2015  . Sexual activity: Not on file   Other Topics Concern  . Not on file   Social History Narrative  . No narrative on  file    Review of Systems: See HPI, otherwise negative ROS  Physical Exam: BP 115/67   Pulse 81   Temp 98.2 F (36.8 C) (Temporal)   Resp 16   Ht 5\' 6"  (1.676 m)   Wt 133 lb (60.3 kg)   LMP 10/13/2006 (Approximate)   SpO2 98%   BMI 21.47 kg/m  General:   Alert,  pleasant and cooperative in NAD Head:  Normocephalic and atraumatic. Neck:  Supple; no masses or thyromegaly. Lungs:  Clear throughout to auscultation.    Heart:  Regular rate and rhythm. Abdomen:  Soft, nontender and nondistended. Normal bowel sounds, without guarding, and without rebound.   Neurologic:  Alert and  oriented x4;  grossly normal neurologically.  Impression/Plan: Kristin Orr is here for an colonoscopy to be performed for history of colon  polyps  Risks, benefits, limitations, and alternatives regarding  colonoscopy have been reviewed with the patient.  Questions have been answered.  All parties agreeable.   Midge Minium, MD  06/04/2017, 9:31 AM

## 2017-06-05 ENCOUNTER — Encounter: Payer: Self-pay | Admitting: Gastroenterology

## 2017-12-07 ENCOUNTER — Telehealth: Payer: Self-pay | Admitting: Gastroenterology

## 2017-12-07 NOTE — Telephone Encounter (Signed)
Pt left vm for Dr. Servando SnareWohl she would like a call did not specify details

## 2017-12-09 NOTE — Telephone Encounter (Signed)
Pt was calling to confirm her mothers office appt with Dr. Servando SnareWohl on Thursday, Feb 28th.

## 2020-05-18 ENCOUNTER — Ambulatory Visit: Payer: Self-pay | Admitting: Physician Assistant

## 2020-05-18 ENCOUNTER — Other Ambulatory Visit: Payer: Self-pay

## 2020-05-18 DIAGNOSIS — Z113 Encounter for screening for infections with a predominantly sexual mode of transmission: Secondary | ICD-10-CM

## 2020-05-20 NOTE — Progress Notes (Signed)
Patient into clinic for IS visit but has multiple other complaints/concerns.  States that she wants to be checked for her headaches, back pain, "all infection", and  UTI/kidney infection.  Counseled patient that we can check her for STDs (GC/Chlamydia, yeast, BV, Trich, HIV and Syphilis).  Patient with history of Hep C already but states that she has cleared the virus.  Counseled that we cannot evaluate her for the other concerns and offered to do the STD screening.  Patient opts to go to the ER/urgent care/PCP to be evaluated for everything at one time.

## 2022-10-01 ENCOUNTER — Ambulatory Visit: Payer: Disability Insurance | Admitting: Gastroenterology

## 2022-10-01 ENCOUNTER — Telehealth: Payer: Self-pay | Admitting: Gastroenterology

## 2022-10-01 NOTE — Telephone Encounter (Signed)
Pt left message stating that she is having rectal bleeding she had an appointment for today and resched for 10/08/2022.

## 2022-10-03 NOTE — Telephone Encounter (Signed)
I spoke to pt... ED precautions given and is aware that there are no sooner appts as we are closed through 12/27

## 2022-10-08 ENCOUNTER — Ambulatory Visit: Payer: Disability Insurance | Admitting: Gastroenterology

## 2022-10-30 ENCOUNTER — Telehealth: Payer: Self-pay | Admitting: Gastroenterology

## 2022-10-30 ENCOUNTER — Ambulatory Visit: Payer: Disability Insurance | Admitting: Gastroenterology

## 2022-10-30 NOTE — Telephone Encounter (Signed)
Patient thought her appointment was at 3pm but it was at 1pm. Where can I schedule her?

## 2022-10-30 NOTE — Progress Notes (Deleted)
Gastroenterology Consultation  Referring Provider:     [facility] ED Primary Care Physician:  Patient, No Pcp Per Primary Gastroenterologist:  Dr. Allen Norris     Reason for Consultation:     Nausea vomiting        HPI:   Kristin Orr is a 47 y.o. y/o female referred for consultation & management of nausea and vomiting by Dr. Patient, No Pcp Per.  This patient comes in today after having a colonoscopy by me in 2018 and was recommended to have a repeat colonoscopy in 5 years.  The patient was in the ED at [facility] in November and December for nausea and vomiting.  The patient had a office visit with me 7 days later that she canceled and rescheduled to the following week which she also canceled.  The patient now is following up for those issues.  It was reported at the second and emergency department visit that the patient was having some vomiting of blood.  Past Medical History:  Diagnosis Date   Anxiety    Asthma    does not use inhalers   Avascular necrosis of bone (HCC)    knees (before replacements)   Depression    Family history of adverse reaction to anesthesia    mother - slow to awaken   Headache    migraine hx - none iin 3 yrs   Hepatitis    Hepatitis C   Hypothyroidism    Multilevel degenerative disc disease    no limitations in movement   Restless leg syndrome    Scoliosis    Wears contact lenses    Wears dentures    full upper, partial lower    Past Surgical History:  Procedure Laterality Date   ABDOMINAL HYSTERECTOMY     CESAREAN SECTION     COLONOSCOPY WITH PROPOFOL N/A 06/04/2017   Procedure: COLONOSCOPY WITH PROPOFOL;  Surgeon: Lucilla Lame, MD;  Location: [facility];  Service: Gastroenterology;  Laterality: N/A;   DILATION AND CURETTAGE OF UTERUS     JOINT REPLACEMENT Right    Total Knee Replacement   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 Authorizing Provider  albuterol (PROVENTIL HFA;VENTOLIN HFA) 108 (90 Base) MCG/ACT inhaler Inhale 2 puffs into the lungs every 6 (six) hours as needed for wheezing or shortness of breath. Patient not taking: Reported on 09/24/2016 07/18/16   Schuyler Amor, MD  gabapentin (NEURONTIN) 300 MG capsule Take 600 mg by mouth 3 (three) times daily. Reported on 02/27/2016 (09/24/16 - as needed)    [provider]  ibuprofen (ADVIL,MOTRIN) 800 MG tablet Take 800 mg by mouth every 8 (eight) hours as needed.    [provider]  methadone (DOLOPHINE) 10 MG/ML solution Take 95 mg by mouth daily.     [provider]  polyethylene glycol (GOLYTELY) 236 g solution Drink one 8 oz glass every 20 mins until stools are clear. 09/25/16   Lucilla Lame, MD  enoxaparin (LOVENOX) 40 MG/0.4ML injection Inject 0.4 mLs (40 mg total) into the skin daily. Patient not taking: Reported on 09/05/2015 04/26/15 01/02/16  Duanne Guess, PA-C    Family History  Problem Relation Age of Onset   Heart attack Mother    Hypertension Mother      Social  History   Tobacco Use   Smoking status: Every Day    Packs/day: 1.00    Years: 22.00    Total pack years: 22.00    Types: Cigarettes   Smokeless tobacco: Never   Tobacco comments:    since age 63  Vaping Use   Vaping Use: Some days  Substance Use Topics   Alcohol use: No    Alcohol/week: 0.0 standard drinks of alcohol   Drug use: No    Comment: Former iv drug user. Clean since 02/2015    Allergies as of 10/30/2022 - Review Complete 05/18/2020  Allergen Reaction Noted   Albuterol Other (See Comments) 06/13/2015   Cymbalta [duloxetine hcl] Anaphylaxis 04/18/2015   Sulfa antibiotics Anaphylaxis 06/13/2015   Levaquin [levofloxacin] Swelling 04/18/2015   Tramadol Other (See Comments) 06/13/2015    Review of Systems:    All systems reviewed and negative  except where noted in HPI.   Physical Exam:  LMP 10/13/2006 (Approximate)  Patient's last menstrual period was 10/13/2006 (approximate). General:   Alert,  Well-developed, well-nourished, pleasant and cooperative in NAD Head:  Normocephalic and atraumatic. Eyes:  Sclera clear, no icterus.   Conjunctiva pink. Ears:  Normal auditory acuity. Neck:  Supple; no masses or thyromegaly. Lungs:  Respirations even and unlabored.  Clear throughout to auscultation.   No wheezes, crackles, or rhonchi. No acute distress. Heart:  Regular rate and rhythm; no murmurs, clicks, rubs, or gallops. Abdomen:  Normal bowel sounds.  No bruits.  Soft, non-tender and non-distended without masses, hepatosplenomegaly or hernias noted.  No guarding or rebound tenderness.  Negative Carnett sign.   Rectal:  Deferred.  Pulses:  Normal pulses noted. Extremities:  No clubbing or edema.  No cyanosis. Neurologic:  Alert and oriented x3;  grossly normal neurologically. Skin:  Intact without significant lesions or rashes.  No jaundice. Lymph Nodes:  No significant cervical adenopathy. Psych:  Alert and cooperative. Normal mood and affect.  Imaging Studies: No results found.  Assessment and Plan:   Kristin Orr is a 47 y.o. y/o female ***    Lucilla Lame, MD. Marval Regal    Note: This dictation was prepared with Dragon dictation along with smaller phrase technology. Any transcriptional errors that result from this process are unintentional.

## 2024-07-04 ENCOUNTER — Other Ambulatory Visit: Payer: Self-pay

## 2024-07-04 ENCOUNTER — Emergency Department
Admission: EM | Admit: 2024-07-04 | Discharge: 2024-07-05 | Disposition: A | Discharge status: Home / Self Care | Payer: Self-pay | Attending: Emergency Medicine | Admitting: Emergency Medicine

## 2024-07-04 ENCOUNTER — Emergency Department: Payer: Self-pay

## 2024-07-04 DIAGNOSIS — N12 Tubulo-interstitial nephritis, not specified as acute or chronic: Secondary | ICD-10-CM | POA: Insufficient documentation

## 2024-07-04 LAB — RESP PANEL BY RT-PCR (RSV, FLU A&B, COVID)  RVPGX2
Influenza A by PCR: NEGATIVE
Influenza B by PCR: NEGATIVE
Resp Syncytial Virus by PCR: NEGATIVE
SARS Coronavirus 2 by RT PCR: NEGATIVE

## 2024-07-04 LAB — URINALYSIS, ROUTINE W REFLEX MICROSCOPIC
Bilirubin Urine: NEGATIVE
Glucose, UA: NEGATIVE mg/dL
Ketones, ur: NEGATIVE mg/dL
Nitrite: POSITIVE — AB
Protein, ur: 30 mg/dL — AB
Specific Gravity, Urine: 1.016 (ref 1.005–1.030)
WBC, UA: >50 WBC/hpf (ref 0–5)
pH: 5 (ref 5.0–8.0)

## 2024-07-04 LAB — COMPREHENSIVE METABOLIC PANEL WITH GFR
ALT: 15 U/L (ref 0–44)
AST: 22 U/L (ref 15–41)
Albumin: 3.7 g/dL (ref 3.5–5.0)
Alkaline Phosphatase: 82 U/L (ref 38–126)
Anion gap: 10 (ref 5–15)
BUN: 16 mg/dL (ref 6–20)
CO2: 26 mmol/L (ref 22–32)
Calcium: 9.1 mg/dL (ref 8.9–10.3)
Chloride: 100 mmol/L (ref 98–111)
Creatinine, Ser: 0.88 mg/dL (ref 0.44–1.00)
GFR, Estimated: >60 mL/min (ref >60)
Glucose, Bld: 113 mg/dL — ABNORMAL HIGH (ref 70–99)
Potassium: 3.9 mmol/L (ref 3.5–5.1)
Sodium: 136 mmol/L (ref 135–145)
Total Bilirubin: 0.5 mg/dL (ref 0.0–1.2)
Total Protein: 7.3 g/dL (ref 6.5–8.1)

## 2024-07-04 LAB — CBC
HCT: 37.4 % (ref 36.0–46.0)
Hemoglobin: 12.3 g/dL (ref 12.0–15.0)
MCH: 32.4 pg (ref 26.0–34.0)
MCHC: 32.9 g/dL (ref 30.0–36.0)
MCV: 98.4 fL (ref 80.0–100.0)
Platelets: 225 K/uL (ref 150–400)
RBC: 3.8 MIL/uL — ABNORMAL LOW (ref 3.87–5.11)
RDW: 12 % (ref 11.5–15.5)
WBC: 9.4 K/uL (ref 4.0–10.5)
nRBC: 0 % (ref 0.0–0.2)

## 2024-07-04 LAB — LIPASE, BLOOD: Lipase: 17 U/L (ref 11–51)

## 2024-07-04 MED ORDER — SODIUM CHLORIDE 0.9 % IV BOLUS
1000.0000 mL | Freq: Once | INTRAVENOUS | Status: AC
  Administered 2024-07-05: 1000 mL via INTRAVENOUS

## 2024-07-04 MED ORDER — SODIUM CHLORIDE 0.9 % IV SOLN
1.0000 g | Freq: Once | INTRAVENOUS | Status: AC
  Administered 2024-07-05: 1 g via INTRAVENOUS
  Filled 2024-07-04: qty 10

## 2024-07-04 MED ORDER — KETOROLAC TROMETHAMINE 15 MG/ML IJ SOLN
15.0000 mg | Freq: Once | INTRAMUSCULAR | Status: AC
  Administered 2024-07-05: 15 mg via INTRAVENOUS
  Filled 2024-07-04: qty 1

## 2024-07-04 MED ORDER — ACETAMINOPHEN 325 MG PO TABS
650.0000 mg | ORAL_TABLET | Freq: Once | ORAL | Status: AC | PRN
  Administered 2024-07-04: 650 mg via ORAL
  Filled 2024-07-04: qty 2

## 2024-07-04 NOTE — ED Provider Notes (Signed)
 [facility] Provider Note    Event Date/Time   First MD Initiated Contact with Patient 07/04/24 2305     (approximate)   History   Fever   HPI  Kristin Orr is a 48 y.o. female   Past medical history of avascular necrosis of knees, presents emergency department with UTI symptoms last couple days.  Started with dysuria and abdominal suprapubic cramping and then now bilateral flank pain and fever.  Has had symptoms like this before and was diagnosed with pyelonephritis.  Never had kidney stones.  History of hysterectomy  External Medical Documents Reviewed: Prior hospital notes      Physical Exam   Triage Vital Signs: ED Triage Vitals  Encounter Vitals Group     BP 07/04/24 1810 (!) 133/100     Girls Systolic BP Percentile --      Girls Diastolic BP Percentile --      Boys Systolic BP Percentile --      Boys Diastolic BP Percentile --      Pulse Rate 07/04/24 1810 (!) 104     Resp 07/04/24 1810 18     Temp 07/04/24 1810 (!) 101.1 F (38.4 C)     Temp Source 07/04/24 1810 Oral     SpO2 07/04/24 1810 98 %     Weight --      Height --      Head Circumference --      Peak Flow --      Pain Score 07/04/24 1811 6     Pain Loc --      Pain Education --      Exclude from Growth Chart --     Most recent vital signs: Vitals:   07/04/24 1810 07/04/24 2300  BP: (!) 133/100 (!) 140/77  Pulse: (!) 104 83  Resp: 18 17  Temp: (!) 101.1 F (38.4 C) 100.3 F (37.9 C)  SpO2: 98% 100%    General: Awake, no distress.  CV:  Good peripheral perfusion.  Resp:  Normal effort.  Abd:  No distention.  Other:  Febrile and tachycardic.  Normotensive.  Nontoxic appearance.  Soft benign abdominal exam to deep palpation all quadrants.    ED Results / Procedures / Treatments   Labs (all labs ordered are listed, but only abnormal results are displayed) Labs Reviewed  COMPREHENSIVE METABOLIC PANEL WITH GFR - Abnormal; Notable for the following components:       Result Value   Glucose, Bld 113 (*)    All other components within normal limits  CBC - Abnormal; Notable for the following components:   RBC 3.80 (*)    All other components within normal limits  URINALYSIS, ROUTINE W REFLEX MICROSCOPIC - Abnormal; Notable for the following components:   Color, Urine YELLOW (*)    APPearance CLOUDY (*)    Hgb urine dipstick SMALL (*)    Protein, ur 30 (*)    Nitrite POSITIVE (*)    Leukocytes,Ua LARGE (*)    Bacteria, UA MANY (*)    All other components within normal limits  RESP PANEL BY RT-PCR (RSV, FLU A&B, COVID)  RVPGX2  CULTURE, BLOOD (ROUTINE X 2)  CULTURE, BLOOD (ROUTINE X 2)  LIPASE, BLOOD  PREGNANCY, URINE  LACTIC ACID, PLASMA  LACTIC ACID, PLASMA     I ordered and reviewed the above labs they are notable for urine appears infected.  No leukocytosis.   RADIOLOGY I independently reviewed and interpreted CT of the abdomen  and see no obvious obstructive inflammatory changes I also reviewed radiologist's formal read.   PROCEDURES:  Critical Care performed: Yes, see critical care procedure note(s)  .Critical Care  Performed by: Cyrena Mylar, MD Authorized by: Cyrena Mylar, MD   Critical care provider statement:    Critical care time (minutes):  30   Critical care was time spent personally by me on the following activities:  Development of treatment plan with patient or surrogate, discussions with consultants, evaluation of patient's response to treatment, examination of patient, ordering and review of laboratory studies, ordering and review of radiographic studies, ordering and performing treatments and interventions, pulse oximetry, re-evaluation of patient's condition and review of old charts    MEDICATIONS ORDERED IN ED: Medications  cefTRIAXone  (ROCEPHIN ) 1 g in sodium chloride  0.9 % 100 mL IVPB (1 g Intravenous New Bag/Given 07/05/24 0018)  acetaminophen  (TYLENOL ) tablet 650 mg (650 mg Oral Given 07/04/24 1815)  sodium  chloride 0.9 % bolus 1,000 mL (1,000 mLs Intravenous New Bag/Given 07/05/24 0017)  ketorolac  (TORADOL ) 15 MG/ML injection 15 mg (15 mg Intravenous Given 07/05/24 0018)     IMPRESSION / MDM / ASSESSMENT AND PLAN / ED COURSE  I reviewed the triage vital signs and the nursing notes.                                Patient's presentation is most consistent with acute presentation with potential threat to life or bodily function.  Differential diagnosis includes, but is not limited to, pyelonephritis, UTI, sepsis, renal colic or kidney stone, intra-abdominal infection with appendicitis considered   The patient is on the cardiac monitor to evaluate for evidence of arrhythmia and/or significant heart rate changes.  MDM:    Young woman with pyelonephritis identified on CT scan as well as urinalysis.  Meet sepsis criteria with low-grade fever and low tachycardia which is resolved with antipyretics.  Will give fluids.  Give IV antibiotic.  Technically meets sepsis criteria given a very low grade tachycardia and fever.  I had ordered antibiotics after reviewing the lab analysis showing active infection, but noted no leukocytosis, and normal vital signs that were actually a recheck from triage.  Then noted the triage initial vital signs with tachycardia of 104 and so I think prudent to get sepsis labs, check lactic and blood culture.  Ideally blood culture would have been collected prior to antibiotic administration, but I do doubt life-threatening bacterial infection at this time or bacteremia.  I think okay to get a single set of blood culture and check lactic.  I considered hospitalization for admission or observation however given her overall improvement with initial treatment, otherwise young healthy person, I think she can continue treatment as an outpatient for pyelonephritis and follow-up with PMD.        FINAL CLINICAL IMPRESSION(S) / ED DIAGNOSES   Final diagnoses:  Pyelonephritis      Rx / DC Orders   ED Discharge Orders          Ordered    Ambulatory Referral to Primary Care (Establish Care)        07/05/24 0021             Note:  This document was prepared using Dragon voice recognition software and may include unintentional dictation errors.    Cyrena Mylar, MD 07/05/24 BEATRIS

## 2024-07-04 NOTE — ED Provider Notes (Incomplete)
 [facility] Provider Note    Event Date/Time   First MD Initiated Contact with Patient 07/04/24 2305     (approximate)   History   Fever   HPI  Kristin Orr is a 48 y.o. female   Past medical history of ***    Independent Historian contributed to assessment above: ***  External Medical Documents Reviewed: ***      Physical Exam   Triage Vital Signs: ED Triage Vitals  Encounter Vitals Group     BP 07/04/24 1810 (!) 133/100     Girls Systolic BP Percentile --      Girls Diastolic BP Percentile --      Boys Systolic BP Percentile --      Boys Diastolic BP Percentile --      Pulse Rate 07/04/24 1810 (!) 104     Resp 07/04/24 1810 18     Temp 07/04/24 1810 (!) 101.1 F (38.4 C)     Temp Source 07/04/24 1810 Oral     SpO2 07/04/24 1810 98 %     Weight --      Height --      Head Circumference --      Peak Flow --      Pain Score 07/04/24 1811 6     Pain Loc --      Pain Education --      Exclude from Growth Chart --     Most recent vital signs: Vitals:   07/04/24 1810  BP: (!) 133/100  Pulse: (!) 104  Resp: 18  Temp: (!) 101.1 F (38.4 C)  SpO2: 98%    General: Awake, no distress. *** CV:  Good peripheral perfusion. *** Resp:  Normal effort. *** Abd:  No distention. *** Other:  ***   ED Results / Procedures / Treatments   Labs (all labs ordered are listed, but only abnormal results are displayed) Labs Reviewed  COMPREHENSIVE METABOLIC PANEL WITH GFR - Abnormal; Notable for the following components:      Result Value   Glucose, Bld 113 (*)    All other components within normal limits  CBC - Abnormal; Notable for the following components:   RBC 3.80 (*)    All other components within normal limits  RESP PANEL BY RT-PCR (RSV, FLU A&B, COVID)  RVPGX2  LIPASE, BLOOD  URINALYSIS, ROUTINE W REFLEX MICROSCOPIC  PREGNANCY, URINE     I ordered and reviewed the above labs they are notable for ***  EKG  ED ECG  REPORT I, Ginnie Shams, the attending physician, personally viewed and interpreted this ECG.   Date: 07/04/2024  EKG Time: ***  Rate: ***  Rhythm: {ekg findings:315101}  Axis: ***  Intervals:{conduction defects:17367}  ST&T Change: ***    RADIOLOGY I independently reviewed and interpreted *** I also reviewed radiologist's formal read.   PROCEDURES:  Critical Care performed: {CriticalCareYesNo:19197::Yes, see critical care procedure note(s),No}  Procedures   MEDICATIONS ORDERED IN ED: Medications  acetaminophen  (TYLENOL ) tablet 650 mg (650 mg Oral Given 07/04/24 1815)    External physician / consultants:  I spoke with *** regarding care plan for this patient.   IMPRESSION / MDM / ASSESSMENT AND PLAN / ED COURSE  I reviewed the triage vital signs and the nursing notes.                                Patient's presentation is most  consistent with {EM COPA:27473}  Differential diagnosis includes, but is not limited to, ***   ***The patient is on the cardiac monitor to evaluate for evidence of arrhythmia and/or significant heart rate changes.  MDM:  ***  I considered hospitalization for admission or observation ***        FINAL CLINICAL IMPRESSION(S) / ED DIAGNOSES   Final diagnoses:  None     Rx / DC Orders   ED Discharge Orders     None        Note:  This document was prepared using Dragon voice recognition software and may include unintentional dictation errors.

## 2024-07-04 NOTE — ED Triage Notes (Signed)
 Pt to ED via POV from home. Pt reports fever the last 3 days and feelings of UTI. Pt reports abd and back pain. Hx of kidney infection.

## 2024-07-05 LAB — LACTIC ACID, PLASMA: Lactic Acid, Venous: 1 mmol/L (ref 0.5–1.9)

## 2024-07-05 LAB — PREGNANCY, URINE: Preg Test, Ur: NEGATIVE

## 2024-07-05 MED ORDER — CEFDINIR 300 MG PO CAPS: 300.0000 mg | ORAL_CAPSULE | Freq: Two times a day (BID) | ORAL | 0 refills | Status: AC

## 2024-07-05 NOTE — ED Notes (Signed)
 Per provider only one set of cultures needed.

## 2024-07-05 NOTE — ED Notes (Signed)
 Pt given DC instructions. Pt verbalized understanding of medication and follow up care. Pt ambulatory from Ed without difficulty. NAD noted at time of DC.

## 2024-07-05 NOTE — Discharge Instructions (Signed)
 Take antibiotics for the full course as prescribed.  Take acetaminophen 650 mg and ibuprofen 400 mg every 6 hours for pain.  Take with food.   Thank you for choosing [facility] for your health care today!  Please see your primary doctor this week for a follow up appointment.   If you have any new, worsening, or unexpected symptoms call your doctor right away or come back to the emergency department for reevaluation.  It was my pleasure to care for you today.   Daneil Dan Modesto Charon, MD

## 2024-07-10 LAB — CULTURE, BLOOD (ROUTINE X 2)
Culture: NO GROWTH
Special Requests: ADEQUATE

## 2024-07-19 ENCOUNTER — Telehealth: Payer: Self-pay | Admitting: Emergency Medicine

## 2024-07-19 NOTE — Telephone Encounter (Signed)
 Patient called and left VM saying she is getting worse now.  Called her back and she says she did get better, but now she is having burning with urination and fever.  Denies vomiting. Denies pain in kidneys.  I do not see a urine culture from the visit.  She does not have pcp and did not do any follow up visit.  I advised that she does need to be seen.  I told her she can always come back here, but she could go to urgent care if she chooses.  She will do follow up.
